# Patient Record
Sex: Male | Born: 1985
Health system: Southern US, Community
[De-identification: ages and names within clinical notes are randomized; demographics above are authoritative.]

## PROBLEM LIST (undated history)

## (undated) DIAGNOSIS — Z720 Tobacco use: Secondary | ICD-10-CM

## (undated) DIAGNOSIS — I7100 Dissection of unspecified site of aorta: Secondary | ICD-10-CM

## (undated) HISTORY — PX: FRACTURE SURGERY: SHX138

## (undated) HISTORY — DX: Dissection of unspecified site of aorta: I71.00

## (undated) HISTORY — PX: HERNIA REPAIR: SHX51

## (undated) HISTORY — PX: HAND SURGERY: SHX662

---
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
## 2007-02-25 ENCOUNTER — Emergency Department (HOSPITAL_COMMUNITY): Admission: EM | Admit: 2007-02-25 | Discharge: 2007-02-25 | Payer: Self-pay | Admitting: Family Medicine

## 2008-02-01 ENCOUNTER — Emergency Department (HOSPITAL_COMMUNITY): Admission: EM | Admit: 2008-02-01 | Discharge: 2008-02-01 | Payer: Self-pay | Admitting: Emergency Medicine

## 2008-03-12 ENCOUNTER — Emergency Department (HOSPITAL_COMMUNITY): Admission: EM | Admit: 2008-03-12 | Discharge: 2008-03-12 | Payer: Self-pay | Admitting: Emergency Medicine

## 2008-06-22 ENCOUNTER — Emergency Department (HOSPITAL_COMMUNITY): Admission: EM | Admit: 2008-06-22 | Discharge: 2008-06-22 | Payer: Self-pay | Admitting: Emergency Medicine

## 2008-09-05 ENCOUNTER — Emergency Department (HOSPITAL_COMMUNITY): Admission: EM | Admit: 2008-09-05 | Discharge: 2008-09-05 | Payer: Self-pay | Admitting: Emergency Medicine

## 2008-10-01 ENCOUNTER — Emergency Department (HOSPITAL_COMMUNITY): Admission: EM | Admit: 2008-10-01 | Discharge: 2008-10-01 | Payer: Self-pay | Admitting: Emergency Medicine

## 2008-10-05 ENCOUNTER — Ambulatory Visit (HOSPITAL_COMMUNITY): Admission: RE | Admit: 2008-10-05 | Discharge: 2008-10-05 | Payer: Self-pay | Admitting: Orthopedic Surgery

## 2008-10-10 ENCOUNTER — Encounter: Admission: RE | Admit: 2008-10-10 | Discharge: 2008-11-21 | Payer: Self-pay | Admitting: Orthopedic Surgery

## 2008-12-31 ENCOUNTER — Emergency Department (HOSPITAL_COMMUNITY): Admission: EM | Admit: 2008-12-31 | Discharge: 2009-01-01 | Payer: Self-pay | Admitting: Emergency Medicine

## 2009-03-18 ENCOUNTER — Emergency Department (HOSPITAL_COMMUNITY): Admission: EM | Admit: 2009-03-18 | Discharge: 2009-03-18 | Payer: Self-pay | Admitting: Emergency Medicine

## 2009-03-19 ENCOUNTER — Emergency Department (HOSPITAL_BASED_OUTPATIENT_CLINIC_OR_DEPARTMENT_OTHER): Admission: EM | Admit: 2009-03-19 | Discharge: 2009-03-20 | Payer: Self-pay | Admitting: Emergency Medicine

## 2009-09-26 ENCOUNTER — Ambulatory Visit: Payer: Self-pay | Admitting: Interventional Radiology

## 2009-09-26 ENCOUNTER — Emergency Department (HOSPITAL_BASED_OUTPATIENT_CLINIC_OR_DEPARTMENT_OTHER): Admission: EM | Admit: 2009-09-26 | Discharge: 2009-09-26 | Payer: Self-pay | Admitting: Emergency Medicine

## 2010-09-28 LAB — POCT I-STAT, CHEM 8
BUN: 7 mg/dL (ref 6–23)
Calcium, Ion: 1.18 mmol/L (ref 1.12–1.32)
Chloride: 105 mEq/L (ref 96–112)
HCT: 47 % (ref 39.0–52.0)
Potassium: 3.8 mEq/L (ref 3.5–5.1)
TCO2: 26 mmol/L (ref 0–100)

## 2010-09-28 LAB — CBC
HCT: 45.2 % (ref 39.0–52.0)
MCV: 94.3 fL (ref 78.0–100.0)
RDW: 13 % (ref 11.5–15.5)
WBC: 8 10*3/uL (ref 4.0–10.5)

## 2010-09-28 LAB — DIFFERENTIAL
Basophils Relative: 1 % (ref 0–1)
Eosinophils Absolute: 0.3 10*3/uL (ref 0.0–0.7)
Eosinophils Relative: 4 % (ref 0–5)
Lymphs Abs: 3 10*3/uL (ref 0.7–4.0)
Monocytes Relative: 8 % (ref 3–12)
Neutrophils Relative %: 50 % (ref 43–77)

## 2010-10-01 LAB — CBC
HCT: 48.3 % (ref 39.0–52.0)
Hemoglobin: 16.6 g/dL (ref 13.0–17.0)
MCHC: 34.3 g/dL (ref 30.0–36.0)
MCV: 93.7 fL (ref 78.0–100.0)
Platelets: 147 10*3/uL — ABNORMAL LOW (ref 150–400)
RBC: 5.15 MIL/uL (ref 4.22–5.81)
RDW: 13.4 % (ref 11.5–15.5)
WBC: 4.7 10*3/uL (ref 4.0–10.5)

## 2010-10-06 LAB — BASIC METABOLIC PANEL
BUN: 10 mg/dL (ref 6–23)
CO2: 29 mEq/L (ref 19–32)
Chloride: 102 mEq/L (ref 96–112)
Creatinine, Ser: 1.45 mg/dL (ref 0.4–1.5)
Glucose, Bld: 101 mg/dL — ABNORMAL HIGH (ref 70–99)
Potassium: 4.5 mEq/L (ref 3.5–5.1)

## 2010-11-04 NOTE — Op Note (Signed)
NAMEARGIE, APPLEGATE           ACCOUNT NO.:  [id number]   MEDICAL RECORD NO.:  [id number]          PATIENT TYPE:  AMB   LOCATION:  SDS                          FACILITY:  [facility]   PHYSICIAN:  Artist Pais. Weingold, M.D.DATE OF BIRTH:  1986-06-12   DATE OF PROCEDURE:  10/05/2008  DATE OF DISCHARGE:  10/05/2008                               OPERATIVE REPORT   PREOPERATIVE DIAGNOSIS:  Proximal pole scaphoid fracture on the left.   POSTOPERATIVE DIAGNOSIS:  Proximal pole scaphoid fracture on the left.   ANESTHESIA:  Open reduction and internal fixation of above using 18-mm  standard Accu-Chek screw, dorsal approach.   SURGEON:  Artist Pais. Mina Marble, MD   ASSISTANT:  None.   ANESTHESIA:  General.   TOURNIQUET TIME:  An hour and 15 minutes.   COMPLICATIONS:  None.   DRAINS:  None.   OPERATIVE REPORT:  The patient was taken to the operating suite.  After  induction of adequate general anesthesia, the left upper extremity was  prepped and draped in the usual sterile fashion.  An Esmarch was used to  exsanguinate the limb.  The tourniquet was inflated to 275 mm at this  point in time.  Percutaneous attempts were made to try and localize the  proximal pole of the scaphoid to do a percutaneous scaphoid screw  fixation.  After multiple attempts and 40 minutes of tourniquet time,  decision was made to proceed with an open procedure.  Once this was  done, incision was made dorsally and skin was incised.  Dissection was  carried down the interval between the third and fourth dorsal  compartments.  EPL was transposed.  The capsule overlying the scaphoid  was incised.  Once this was done, the proximal pole of the scaphoid was  exposed.  Guidewire was placed into the proximal pole and 18-mm Accu-  Chek standard screw was placed from proximal to distal under  intraoperative fluoroscopic guidance, reducing the fracture.  The K-wire  was removed.  The wound was thoroughly irrigated.  The  capsule was  repaired with 4-0 Vicryl and the skin with 4-0 nylon.  Xeroform, 4 x 4s,  fluffs, and regular splint was applied.  The patient tolerated the  procedure well and went to recovery room in stable fashion.      Artist Pais Mina Marble, M.D.  Electronically Signed    MAW/MEDQ  D:  10/05/2008  T:  10/06/2008  Job:  [id number]

## 2011-02-08 ENCOUNTER — Emergency Department (HOSPITAL_BASED_OUTPATIENT_CLINIC_OR_DEPARTMENT_OTHER)
Admission: EM | Admit: 2011-02-08 | Discharge: 2011-02-08 | Disposition: A | Discharge status: Home / Self Care | Payer: No Typology Code available for payment source | Attending: Emergency Medicine | Admitting: Emergency Medicine

## 2011-02-08 ENCOUNTER — Emergency Department (INDEPENDENT_AMBULATORY_CARE_PROVIDER_SITE_OTHER): Payer: No Typology Code available for payment source

## 2011-02-08 ENCOUNTER — Encounter: Payer: Self-pay | Admitting: Emergency Medicine

## 2011-02-08 DIAGNOSIS — Z043 Encounter for examination and observation following other accident: Secondary | ICD-10-CM

## 2011-02-08 DIAGNOSIS — M25569 Pain in unspecified knee: Secondary | ICD-10-CM

## 2011-02-08 DIAGNOSIS — Y92410 Unspecified street and highway as the place of occurrence of the external cause: Secondary | ICD-10-CM | POA: Insufficient documentation

## 2011-02-08 DIAGNOSIS — S139XXA Sprain of joints and ligaments of unspecified parts of neck, initial encounter: Secondary | ICD-10-CM | POA: Insufficient documentation

## 2011-02-08 DIAGNOSIS — R071 Chest pain on breathing: Secondary | ICD-10-CM | POA: Insufficient documentation

## 2011-02-08 DIAGNOSIS — S161XXA Strain of muscle, fascia and tendon at neck level, initial encounter: Secondary | ICD-10-CM

## 2011-02-08 DIAGNOSIS — R0789 Other chest pain: Secondary | ICD-10-CM

## 2011-02-08 MED ORDER — TRAMADOL HCL 50 MG PO TABS: 50.0000 mg | ORAL_TABLET | Freq: Four times a day (QID) | ORAL | Status: AC | PRN

## 2011-02-08 MED ORDER — ONDANSETRON 4 MG PO TBDP
4.0000 mg | ORAL_TABLET | Freq: Once | ORAL | Status: AC
  Administered 2011-02-08: 4 mg via ORAL
  Filled 2011-02-08: qty 1
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
## 2011-02-11 NOTE — ED Provider Notes (Signed)
History/physical exam/procedure(s) were performed by non-physician practitioner and as supervising physician I was immediately available for consultation/collaboration. I have reviewed all notes and am in agreement with care and plan.   Hilario Quarry, MD 02/11/11 [phone]

## 2011-07-09 ENCOUNTER — Emergency Department (INDEPENDENT_AMBULATORY_CARE_PROVIDER_SITE_OTHER): Payer: Self-pay

## 2011-07-09 ENCOUNTER — Encounter (HOSPITAL_BASED_OUTPATIENT_CLINIC_OR_DEPARTMENT_OTHER): Payer: Self-pay | Admitting: *Deleted

## 2011-07-09 ENCOUNTER — Emergency Department (HOSPITAL_BASED_OUTPATIENT_CLINIC_OR_DEPARTMENT_OTHER)
Admission: EM | Admit: 2011-07-09 | Discharge: 2011-07-09 | Disposition: A | Discharge status: Home / Self Care | Payer: Self-pay | Attending: Emergency Medicine | Admitting: Emergency Medicine

## 2011-07-09 DIAGNOSIS — R0789 Other chest pain: Secondary | ICD-10-CM

## 2011-07-09 DIAGNOSIS — R071 Chest pain on breathing: Secondary | ICD-10-CM | POA: Insufficient documentation

## 2011-07-09 DIAGNOSIS — R091 Pleurisy: Secondary | ICD-10-CM | POA: Insufficient documentation

## 2011-07-09 DIAGNOSIS — R079 Chest pain, unspecified: Secondary | ICD-10-CM

## 2011-07-09 MED ORDER — IBUPROFEN 800 MG PO TABS: 800.0000 mg | ORAL_TABLET | Freq: Three times a day (TID) | ORAL | Status: AC

## 2011-07-09 MED ORDER — IBUPROFEN 800 MG PO TABS
800.0000 mg | ORAL_TABLET | Freq: Once | ORAL | Status: AC
  Administered 2011-07-09: 800 mg via ORAL
  Filled 2011-07-09: qty 1

## 2011-07-09 MED ORDER — METHOCARBAMOL 500 MG PO TABS: 500.0000 mg | ORAL_TABLET | Freq: Two times a day (BID) | ORAL | Status: AC

## 2011-07-09 NOTE — ED Notes (Signed)
Pt. Reports the L front chest pain started on Today at approx. 11am.  Pt. Reports he runs and exercises.

## 2011-07-09 NOTE — ED Notes (Signed)
Pt. Reports he is hurting in the L chest area.  Pt. Has no c/o shortness of breath and is alert and oriented.

## 2011-07-09 NOTE — ED Notes (Signed)
Pt amb to triage with quick steady gait in nad. Pt reports chest "sore" x 11 today, sudden onset, chest is tender to palp, pain increases with movements of arms. Pt denies any memory of injury, denies any sob, nausea, diaphoresis or any other c/o. Pt states he took 3 tums with some relief.

## 2011-07-09 NOTE — ED Provider Notes (Signed)
History     CSN: [id number]  Arrival date & time 07/09/11  1846   First MD Initiated Contact with Patient 07/09/11 1906      Chief Complaint  Patient presents with  . Pleurisy    (Consider location/radiation/quality/duration/timing/severity/associated sxs/prior treatment) The history is provided by the patient and a parent.   patient presents with left-sided chest pain x12 hours. Patient used TUMS with no relief of his symptoms. Pain is described as sharp in nature, worse with movement of his arm or trunk. Notes that he has been running a lot. Denies any cough or fever. No associated dyspnea or diaphoresis. Denies any heavy lifting. No prior history of same. No rash noted to the skin.  History reviewed. No pertinent past medical history.  History reviewed. No pertinent past surgical history.  History reviewed. No pertinent family history.  History  Substance Use Topics  . Smoking status: Never Smoker   . Smokeless tobacco: Not on file  . Alcohol Use: No      Review of Systems  All other systems reviewed and are negative.    Allergies  Review of patient's allergies indicates no known allergies.  Home Medications   Current Outpatient Rx  Name Route Sig Dispense Refill  . CALCIUM CARBONATE ANTACID 500 MG PO CHEW Oral Chew 1,500 tablets by mouth once as needed. For indigestion      BP 132/80  Pulse 71  Temp(Src) 98.8 F (37.1 C) (Oral)  Resp 18  Ht 6\' 2"  (1.88 m)  Wt 312 lb (141.522 kg)  BMI 40.06 kg/m2  SpO2 100%  Physical Exam  Nursing note and vitals reviewed. Constitutional: He is oriented to person, place, and time. He appears well-developed and well-nourished.  Non-toxic appearance. No distress.  HENT:  Head: Normocephalic and atraumatic.  Eyes: Conjunctivae, EOM and lids are normal. Pupils are equal, round, and reactive to light.  Neck: Normal range of motion. Neck supple. No tracheal deviation present. No mass present.  Cardiovascular: Normal rate,  regular rhythm and normal heart sounds.  Exam reveals no gallop.   No murmur heard. Pulmonary/Chest: Effort normal and breath sounds normal. No stridor. No respiratory distress. He has no decreased breath sounds. He has no wheezes. He has no rhonchi. He has no rales. He exhibits tenderness and bony tenderness. He exhibits no crepitus.    Abdominal: Soft. Normal appearance and bowel sounds are normal. He exhibits no distension. There is no tenderness. There is no rebound and no CVA tenderness.  Musculoskeletal: Normal range of motion. He exhibits no edema and no tenderness.  Neurological: He is alert and oriented to person, place, and time. He has normal strength. No cranial nerve deficit or sensory deficit. GCS eye subscore is 4. GCS verbal subscore is 5. GCS motor subscore is 6.  Skin: Skin is warm and dry. No abrasion and no rash noted.  Psychiatric: He has a normal mood and affect. His speech is normal and behavior is normal.    ED Course  Procedures (including critical care time)  Labs Reviewed - No data to display No results found.   No diagnosis found.    MDM  Chest x-ray is negative. Will place on anti-inflammatories and muscle relaxants and discharged to home         Toy Baker, MD 07/09/11 2118

## 2011-09-26 ENCOUNTER — Emergency Department (HOSPITAL_BASED_OUTPATIENT_CLINIC_OR_DEPARTMENT_OTHER)
Admission: EM | Admit: 2011-09-26 | Discharge: 2011-09-26 | Disposition: A | Discharge status: Home / Self Care | Payer: Self-pay | Attending: Emergency Medicine | Admitting: Emergency Medicine

## 2011-09-26 ENCOUNTER — Encounter (HOSPITAL_BASED_OUTPATIENT_CLINIC_OR_DEPARTMENT_OTHER): Payer: Self-pay | Admitting: Emergency Medicine

## 2011-09-26 DIAGNOSIS — K0889 Other specified disorders of teeth and supporting structures: Secondary | ICD-10-CM

## 2011-09-26 DIAGNOSIS — K089 Disorder of teeth and supporting structures, unspecified: Secondary | ICD-10-CM | POA: Insufficient documentation

## 2011-09-26 MED ORDER — HYDROCODONE-ACETAMINOPHEN 5-325 MG PO TABS: 2.0000 | ORAL_TABLET | Freq: Four times a day (QID) | ORAL | Status: AC | PRN

## 2011-09-26 MED ORDER — HYDROCODONE-ACETAMINOPHEN 5-325 MG PO TABS
2.0000 | ORAL_TABLET | Freq: Once | ORAL | Status: AC
  Administered 2011-09-26: 2 via ORAL
  Filled 2011-09-26: qty 2

## 2011-09-26 NOTE — ED Provider Notes (Signed)
History     CSN: [id number]  Arrival date & time 09/26/11  0041   First MD Initiated Contact with Patient 09/26/11 0107      Chief Complaint  Patient presents with  . Dental Pain  . Headache    (Consider location/radiation/quality/duration/timing/severity/associated sxs/prior treatment) HPI The patient presents with one day of pain in his left superior jaw.  Patient has known cavity in the region.  He awoke this morning, approximately 18 hours ago with pain.  Since onset the pain has been focally in the left upper jaw without radiation.  Pain is sharp, worse with clenching of his teeth.  No relief with Advil or Orajel.  No fevers, no chills, no facial swelling, no dyspnea aphasia or dyspnea. History reviewed. No pertinent past medical history.  Past Surgical History  Procedure Date  . Hand surgery     No family history on file.  History  Substance Use Topics  . Smoking status: Never Smoker   . Smokeless tobacco: Not on file  . Alcohol Use: No      Review of Systems  All other systems reviewed and are negative.    Allergies  Review of patient's allergies indicates no known allergies.  Home Medications   Current Outpatient Rx  Name Route Sig Dispense Refill  . BENZOCAINE 10 % MT GEL Mouth/Throat Use as directed 1 application in the mouth or throat as needed.    . IBUPROFEN 200 MG PO TABS Oral Take 200 mg by mouth as needed.    Marland Kitchen CALCIUM CARBONATE ANTACID 500 MG PO CHEW Oral Chew 1,500 tablets by mouth once as needed. For indigestion    . HYDROCODONE-ACETAMINOPHEN 5-325 MG PO TABS Oral Take 2 tablets by mouth every 6 (six) hours as needed for pain. 12 tablet 0    BP 149/92  Pulse 69  Temp(Src) 98.8 F (37.1 C) (Oral)  Resp 18  SpO2 97%  Physical Exam  Nursing note and vitals reviewed. Constitutional: He is oriented to person, place, and time. He appears well-developed and well-nourished. No distress.  HENT:  Head: Normocephalic and atraumatic.    Mouth/Throat: Uvula is midline, oropharynx is clear and moist and mucous membranes are normal. No oropharyngeal exudate, posterior oropharyngeal edema, posterior oropharyngeal erythema or tonsillar abscesses.    Eyes: Conjunctivae and EOM are normal.  Cardiovascular: Normal rate and regular rhythm.   Pulmonary/Chest: Effort normal. No stridor.  Neurological: He is alert and oriented to person, place, and time. No cranial nerve deficit. Coordination normal.  Skin: Skin is warm and dry. He is not diaphoretic.  Psychiatric: He has a normal mood and affect.    ED Course  Procedures (including critical care time)  Labs Reviewed - No data to display No results found.   1. Pain, dental       MDM  This generally well young male presents with one day of dental pain.  On exam the patient is in no distress unremarkable vital signs and no evidence of ongoing abscess.  Patient received oral analgesics.  Discussed the need for ongoing, close followup with dentistry.  The patient was discharged in stable condition      Gerhard Munch, MD 09/26/11 [phone]

## 2011-09-26 NOTE — ED Notes (Signed)
Pt c/o dental pain and headache

## 2011-09-26 NOTE — Discharge Instructions (Signed)
Dental Pain  A tooth ache may be caused by cavities (tooth decay). Cavities expose the nerve of the tooth to air and hot or cold temperatures. It may come from an infection or abscess (also called a boil or furuncle) around your tooth. It is also often caused by dental caries (tooth decay). This causes the pain you are having.  DIAGNOSIS   Your caregiver can diagnose this problem by exam.  TREATMENT   · If caused by an infection, it may be treated with medications which kill germs (antibiotics) and pain medications as prescribed by your caregiver. Take medications as directed.  · Only take over-the-counter or prescription medicines for pain, discomfort, or fever as directed by your caregiver.  · Whether the tooth ache today is caused by infection or dental disease, you should see your dentist as soon as possible for further care.  SEEK MEDICAL CARE IF:  The exam and treatment you received today has been provided on an emergency basis only. This is not a substitute for complete medical or dental care. If your problem worsens or new problems (symptoms) appear, and you are unable to meet with your dentist, call or return to this location.  SEEK IMMEDIATE MEDICAL CARE IF:   · You have a fever.  · You develop redness and swelling of your face, jaw, or neck.  · You are unable to open your mouth.  · You have severe pain uncontrolled by pain medicine.  MAKE SURE YOU:   · Understand these instructions.  · Will watch your condition.  · Will get help right away if you are not doing well or get worse.  Document Released: 06/08/2005 Document Revised: 05/28/2011 Document Reviewed: 01/25/2008  [employer]® Patient Information ©2012 [employer].

## 2014-10-09 ENCOUNTER — Emergency Department (HOSPITAL_COMMUNITY)
Admission: EM | Admit: 2014-10-09 | Discharge: 2014-10-09 | Discharge status: Absent without leave | Payer: No Typology Code available for payment source

## 2014-10-09 ENCOUNTER — Emergency Department (HOSPITAL_COMMUNITY)
Admission: EM | Admit: 2014-10-09 | Discharge: 2014-10-09 | Discharge status: Against Medical Advice | Payer: Self-pay | Attending: Emergency Medicine | Admitting: Emergency Medicine

## 2014-10-09 ENCOUNTER — Encounter (HOSPITAL_COMMUNITY): Payer: Self-pay | Admitting: Emergency Medicine

## 2014-10-09 DIAGNOSIS — R1031 Right lower quadrant pain: Secondary | ICD-10-CM | POA: Insufficient documentation

## 2014-10-09 NOTE — ED Notes (Signed)
Pt called for room placement with no answer. 

## 2014-10-09 NOTE — ED Notes (Signed)
Pt reports eating a sub and began having right lower abdominal pain for the past 30 minutes. Pt reports he is unable to sleep due to pain.

## 2014-10-11 ENCOUNTER — Emergency Department (HOSPITAL_BASED_OUTPATIENT_CLINIC_OR_DEPARTMENT_OTHER)
Admission: EM | Admit: 2014-10-11 | Discharge: 2014-10-11 | Disposition: A | Discharge status: Home / Self Care | Payer: Self-pay | Attending: Emergency Medicine | Admitting: Emergency Medicine

## 2014-10-11 ENCOUNTER — Encounter (HOSPITAL_BASED_OUTPATIENT_CLINIC_OR_DEPARTMENT_OTHER): Payer: Self-pay | Admitting: *Deleted

## 2014-10-11 DIAGNOSIS — Z79899 Other long term (current) drug therapy: Secondary | ICD-10-CM | POA: Insufficient documentation

## 2014-10-11 DIAGNOSIS — R1031 Right lower quadrant pain: Secondary | ICD-10-CM | POA: Insufficient documentation

## 2014-10-11 LAB — URINE MICROSCOPIC-ADD ON

## 2014-10-11 LAB — URINALYSIS, ROUTINE W REFLEX MICROSCOPIC
BILIRUBIN URINE: NEGATIVE
Glucose, UA: NEGATIVE mg/dL
HGB URINE DIPSTICK: NEGATIVE
Ketones, ur: NEGATIVE mg/dL
Nitrite: NEGATIVE
PROTEIN: NEGATIVE mg/dL
Specific Gravity, Urine: 1.024 (ref 1.005–1.030)
UROBILINOGEN UA: 1 mg/dL (ref 0.0–1.0)
pH: 8 (ref 5.0–8.0)

## 2014-10-11 NOTE — Discharge Instructions (Signed)

## 2014-10-11 NOTE — ED Notes (Signed)
MD at bedside. 

## 2014-10-11 NOTE — ED Notes (Signed)
Abdominal pain x 2 days. Feels like gas. Pain is in his right upper quadrant.

## 2014-10-11 NOTE — ED Provider Notes (Signed)
CSN: [id number]     Arrival date & time 10/11/14  1042 History   First MD Initiated Contact with Patient 10/11/14 1122     Chief Complaint  Patient presents with  . Abdominal Pain     (Consider location/radiation/quality/duration/timing/severity/associated sxs/prior Treatment) Patient is a 29 y.o. male presenting with abdominal pain. The history is provided by the patient.  Abdominal Pain Pain location:  RLQ Pain quality: aching and cramping   Pain radiates to:  Groin Pain severity:  Severe Onset quality:  Sudden Duration: . Timing:  Constant Progression:  Resolved Chronicity:  New Context comment:  Started after he ate an [place] [employer] [place] [employer] Relieved by:  None tried Worsened by:  Nothing tried Ineffective treatments:  None tried Associated symptoms: no anorexia, no chest pain, no chills, no constipation, no diarrhea, no dysuria, no fever, no hematuria, no nausea and no vomiting   Risk factors: no alcohol abuse, has not had multiple surgeries and no recent hospitalization     History reviewed. No pertinent past medical history. Past Surgical History  Procedure Laterality Date  . Hand surgery    . Hernia repair     No family history on file. History  Substance Use Topics  . Smoking status: Never Smoker   . Smokeless tobacco: Not on file  . Alcohol Use: No    Review of Systems  Constitutional: Negative for fever and chills.  Cardiovascular: Negative for chest pain.  Gastrointestinal: Positive for abdominal pain. Negative for nausea, vomiting, diarrhea, constipation and anorexia.  Genitourinary: Negative for dysuria and hematuria.  All other systems reviewed and are negative.     Allergies  Review of patient's allergies indicates no known allergies.  Home Medications   Prior to Admission medications   Medication Sig Start Date End Date Taking? Authorizing Provider  benzocaine (ORAJEL) 10 % mucosal gel Use as directed 1 application in the mouth or throat  as needed.    Historical Provider, MD  calcium carbonate (TUMS - DOSED IN MG ELEMENTAL CALCIUM) 500 MG chewable tablet Chew 1,500 tablets by mouth once as needed. For indigestion    Historical Provider, MD  ibuprofen (ADVIL,MOTRIN) 200 MG tablet Take 200 mg by mouth as needed.    Historical Provider, MD   BP 130/90 mmHg  Pulse 63  Temp(Src) 98.6 F (37 C) (Oral)  Resp 18  Ht  (1.905 m)  Wt 324 lb (146.965 kg)  BMI 40.50 kg/m2  SpO2 99% Physical Exam  Constitutional: He is oriented to person, place, and time. He appears well-developed and well-nourished. No distress.  HENT:  Head: Normocephalic and atraumatic.  Mouth/Throat: Oropharynx is clear and moist.  Eyes: Conjunctivae and EOM are normal. Pupils are equal, round, and reactive to light.  Neck: Normal range of motion. Neck supple.  Cardiovascular: Normal rate, regular rhythm and intact distal pulses.   No murmur heard. Pulmonary/Chest: Effort normal and breath sounds normal. No respiratory distress. He has no wheezes. He has no rales.  Abdominal: Soft. He exhibits no distension. There is no tenderness. There is no rebound and no guarding.  Musculoskeletal: Normal range of motion. He exhibits no edema or tenderness.  Neurological: He is alert and oriented to person, place, and time.  Skin: Skin is warm and dry. No rash noted. No erythema.  Psychiatric: He has a normal mood and affect. His behavior is normal.  Nursing note and vitals reviewed.   ED Course  Procedures (including critical care time) Labs Review Labs Reviewed  URINALYSIS, ROUTINE W REFLEX MICROSCOPIC - Abnormal; Notable for the following:    APPearance CLOUDY (*)    Leukocytes, UA SMALL (*)    All other components within normal limits  URINE CULTURE  URINE MICROSCOPIC-ADD ON    Imaging Review No results found.   EKG Interpretation None      MDM   Final diagnoses:  Right lower quadrant abdominal pain    Patient presenting for evaluation  after having severe abdominal pain 2 days ago. He states currently he has no abdominal tenderness and just wanted to be checked before he started work. He has had no difficulty eating and denies any nausea, vomiting or urinary complaints currently. Approximately 2 days ago when the pain started after he ate [place] from [employer] he was having severe right lower quadrant pain that shot into his testicle and groin. However that has subsided. He currently denies any dysuria or penile discharge. UA done here showed no signs of red blood cells but did have 3-6 white blood cells. Urine cultured however at this time will not treat.    Gwyneth SproutWhitney Angeleigh Chiasson, MD 10/11/14 1204

## 2014-10-12 LAB — URINE CULTURE: Colony Count: 6000

## 2017-04-02 ENCOUNTER — Emergency Department
Admission: EM | Admit: 2017-04-02 | Discharge: 2017-04-02 | Disposition: A | Discharge status: Home / Self Care | Payer: Self-pay | Attending: Emergency Medicine | Admitting: Emergency Medicine

## 2017-04-02 ENCOUNTER — Encounter: Payer: Self-pay | Admitting: Emergency Medicine

## 2017-04-02 DIAGNOSIS — K029 Dental caries, unspecified: Secondary | ICD-10-CM | POA: Insufficient documentation

## 2017-04-02 MED ORDER — IBUPROFEN 600 MG PO TABS: 600.0000 mg | ORAL_TABLET | Freq: Three times a day (TID) | ORAL | 0 refills | Status: DC | PRN

## 2017-04-02 MED ORDER — LIDOCAINE VISCOUS 2 % MT SOLN: 5.0000 mL | Freq: Four times a day (QID) | OROMUCOSAL | 0 refills | Status: DC | PRN

## 2017-04-02 MED ORDER — AMOXICILLIN 500 MG PO CAPS: 500.0000 mg | ORAL_CAPSULE | Freq: Three times a day (TID) | ORAL | 0 refills | Status: DC

## 2017-04-02 MED ORDER — TRAMADOL HCL 50 MG PO TABS: 50.0000 mg | ORAL_TABLET | Freq: Two times a day (BID) | ORAL | 0 refills | Status: DC | PRN

## 2017-04-02 NOTE — ED Provider Notes (Signed)
[facility] Emergency Department Provider Note   ____________________________________________   First MD Initiated Contact with Patient 04/02/17 1027     (approximate)  I have reviewed the triage vital signs and the nursing notes.   HISTORY  Chief Complaint Dental Pain    HPI Aaron [name] is a 31 y.o. male patient complaining of dental pain which started yesterday. Patient state he has a cracked tooth.patient rates his pain as a 10 over 10. o palliative measures for complaint. Patient as referral to dental clinic.   History reviewed. No pertinent past medical history.  There are no active problems to display for this patient.   Past Surgical History:  Procedure Laterality Date  . FRACTURE SURGERY    . HAND SURGERY    . HERNIA REPAIR      Prior to Admission medications   Medication Sig Start Date End Date Taking? Authorizing Provider  amoxicillin (AMOXIL) 500 MG capsule Take 1 capsule (500 mg total) by mouth 3 (three) times daily. 04/02/17   Joni Reining, PA-C  benzocaine (ORAJEL) 10 % mucosal gel Use as directed 1 application in the mouth or throat as needed.    [provider]  calcium carbonate (TUMS - DOSED IN MG ELEMENTAL CALCIUM) 500 MG chewable tablet Chew 1,500 tablets by mouth once as needed. For indigestion    [provider]  ibuprofen (ADVIL,MOTRIN) 200 MG tablet Take 200 mg by mouth as needed.    [provider]  ibuprofen (ADVIL,MOTRIN) 600 MG tablet Take 1 tablet (600 mg total) by mouth every 8 (eight) hours as needed. 04/02/17   Joni Reining, PA-C  lidocaine (XYLOCAINE) 2 % solution Use as directed 5 mLs in the mouth or throat every 6 (six) hours as needed for mouth pain. 04/02/17   Joni Reining, PA-C  traMADol (ULTRAM) 50 MG tablet Take 1 tablet (50 mg total) by mouth every 12 (twelve) hours as needed. 04/02/17   Joni Reining, PA-C    Allergies Patient has no known allergies.  No  family history on file.  Social History Social History  Substance Use Topics  . Smoking status: Never Smoker  . Smokeless tobacco: Never Used  . Alcohol use No    Review of Systems Constitutional: No fever/chills Eyes: No visual changes. ENT: No sore throat. Ental pain Cardiovascular: Denies chest pain. Respiratory: Denies shortness of breath. Gastrointestinal: No abdominal pain.  No nausea, no vomiting.  No diarrhea.  No constipation. Genitourinary: Negative for dysuria. Musculoskeletal: Negative for back pain. Skin: Negative for rash. Neurological: Negative for headaches, focal weakness or numbness.   ____________________________________________   PHYSICAL EXAM:  VITAL SIGNS: ED Triage Vitals  Enc Vitals Group     BP 04/02/17 0959 138/83     Pulse Rate 04/02/17 0959 60     Resp 04/02/17 0959 16     Temp 04/02/17 0959 98.3 F (36.8 C)     Temp Source 04/02/17 0959 Oral     SpO2 04/02/17 0959 99 %     Weight 04/02/17 1000 290 lb (131.5 kg)     Height 04/02/17 1000  (1.905 m)     Head Circumference --      Peak Flow --      Pain Score 04/02/17 0951 10     Pain Loc --      Pain Edu? --      Excl. in GC? --    Constitutional: Alert and oriented. Well appearing  and in no acute distress. Mouth/Throat: Mucous membranes are moist.  Oropharynx non-erythematous.fractured tooth 14 Neck: No stridor.  Cardiovascular: Normal rate, regular rhythm. Grossly normal heart sounds.  Good peripheral circulation. Respiratory: Normal respiratory effort.  No retractions. Lungs CTAB. Neurologic:  Normal speech and language. No gross focal neurologic deficits are appreciated. No gait instability. Skin:  Skin is warm, dry and intact. No rash noted. Psychiatric: Mood and affect are normal. Speech and behavior are normal.  ____________________________________________   LABS (all labs ordered are listed, but only abnormal results are displayed)  Labs Reviewed - No data to  display ____________________________________________  EKG   ____________________________________________  RADIOLOGY  No results found.  ____________________________________________   PROCEDURES  Procedure(s) performed: None  Procedures  Critical Care performed: No  ____________________________________________   INITIAL IMPRESSION / ASSESSMENT AND PLAN / ED COURSE  As part of my medical decision making, I reviewed the following data within the electronic MEDICAL RECORD NUMBER    Dental pain secondary to fractured tooth #14.patient given discharge Instructions. Patient advised follow-up with the [facility] dental school. Advised take medication as directed.      ____________________________________________   FINAL CLINICAL IMPRESSION(S) / ED DIAGNOSES  Final diagnoses:  Pain due to dental caries      NEW MEDICATIONS STARTED DURING THIS VISIT:  New Prescriptions   AMOXICILLIN (AMOXIL) 500 MG CAPSULE    Take 1 capsule (500 mg total) by mouth 3 (three) times daily.   IBUPROFEN (ADVIL,MOTRIN) 600 MG TABLET    Take 1 tablet (600 mg total) by mouth every 8 (eight) hours as needed.   LIDOCAINE (XYLOCAINE) 2 % SOLUTION    Use as directed 5 mLs in the mouth or throat every 6 (six) hours as needed for mouth pain.   TRAMADOL (ULTRAM) 50 MG TABLET    Take 1 tablet (50 mg total) by mouth every 12 (twelve) hours as needed.     Note:  This document was prepared using Dragon voice recognition software and may include unintentional dictation errors.    Joni Reining, PA-C 04/02/17 1037    Minna Antis, MD 04/02/17 [phone]

## 2017-04-02 NOTE — ED Notes (Signed)
Pt c/o pain in the roof of the mouth, states the pain has worsened since last night. Pt is 9/10 currently. No meds taken at home.

## 2017-04-02 NOTE — ED Triage Notes (Signed)
Pt states that he has been having dental pain since yesterday

## 2017-09-03 ENCOUNTER — Emergency Department (HOSPITAL_BASED_OUTPATIENT_CLINIC_OR_DEPARTMENT_OTHER)
Admission: EM | Admit: 2017-09-03 | Discharge: 2017-09-03 | Disposition: A | Discharge status: Home / Self Care | Payer: Self-pay | Attending: Emergency Medicine | Admitting: Emergency Medicine

## 2017-09-03 ENCOUNTER — Other Ambulatory Visit: Payer: Self-pay

## 2017-09-03 ENCOUNTER — Encounter (HOSPITAL_BASED_OUTPATIENT_CLINIC_OR_DEPARTMENT_OTHER): Payer: Self-pay | Admitting: Emergency Medicine

## 2017-09-03 DIAGNOSIS — Z79899 Other long term (current) drug therapy: Secondary | ICD-10-CM | POA: Insufficient documentation

## 2017-09-03 DIAGNOSIS — K029 Dental caries, unspecified: Secondary | ICD-10-CM | POA: Insufficient documentation

## 2017-09-03 DIAGNOSIS — K0889 Other specified disorders of teeth and supporting structures: Secondary | ICD-10-CM

## 2017-09-03 MED ORDER — BUPIVACAINE-EPINEPHRINE (PF) 0.5% -1:200000 IJ SOLN
1.8000 mL | Freq: Once | INTRAMUSCULAR | Status: AC
  Administered 2017-09-03: 1.8 mL
  Filled 2017-09-03: qty 1.8

## 2017-09-03 MED ORDER — PENICILLIN V POTASSIUM 500 MG PO TABS: 500.0000 mg | ORAL_TABLET | Freq: Four times a day (QID) | ORAL | 0 refills | Status: AC

## 2017-09-03 MED ORDER — IBUPROFEN 600 MG PO TABS: 600.0000 mg | ORAL_TABLET | Freq: Four times a day (QID) | ORAL | 0 refills | Status: DC | PRN

## 2017-09-03 MED ORDER — TRAMADOL HCL 50 MG PO TABS: 50.0000 mg | ORAL_TABLET | Freq: Three times a day (TID) | ORAL | 0 refills | Status: DC | PRN

## 2017-09-03 MED FILL — PENICILLIN VK 500 MG TABLET: 500 | 10 days supply | Qty: 40 | Fill #0

## 2017-09-03 MED FILL — traMADol HCL 50 MG TABS: 50 | 4 days supply | Qty: 10 | Fill #0

## 2017-09-03 NOTE — Discharge Instructions (Signed)
Take the antibiotics prescribed you until finished.  Follow-up with a dentist as soon as you are able.

## 2017-09-03 NOTE — ED Provider Notes (Signed)
[facility] EMERGENCY DEPARTMENT Provider Note   CSN: [id number] Arrival date & time: 09/03/17  [id number]     History   Chief Complaint Chief Complaint  Patient presents with  . Dental Pain    HPI Aaron [name] is a 32 y.o. male.  The history is provided by the patient. No language interpreter was used.  Dental Pain   This is a new problem. The current episode started 12 to 24 hours ago. The problem occurs constantly. The problem has been gradually worsening. The pain is moderate. Treatments tried: "some pills" and topical analgesic. The treatment provided no relief.    History reviewed. No pertinent past medical history.  There are no active problems to display for this patient.   Past Surgical History:  Procedure Laterality Date  . FRACTURE SURGERY    . HAND SURGERY    . HERNIA REPAIR         Home Medications    Prior to Admission medications   Medication Sig Start Date End Date Taking? Authorizing Provider  amoxicillin (AMOXIL) 500 MG capsule Take 1 capsule (500 mg total) by mouth 3 (three) times daily. 04/02/17   Joni ReiningSmith, Ronald K, PA-C  benzocaine (ORAJEL) 10 % mucosal gel Use as directed 1 application in the mouth or throat as needed.    [provider]  calcium carbonate (TUMS - DOSED IN MG ELEMENTAL CALCIUM) 500 MG chewable tablet Chew 1,500 tablets by mouth once as needed. For indigestion    [provider]  ibuprofen (ADVIL,MOTRIN) 600 MG tablet Take 1 tablet (600 mg total) by mouth every 6 (six) hours as needed for mild pain or moderate pain. 09/03/17   Antony MaduraHumes, Shadrach Bartunek, PA-C  lidocaine (XYLOCAINE) 2 % solution Use as directed 5 mLs in the mouth or throat every 6 (six) hours as needed for mouth pain. 04/02/17   Joni ReiningSmith, Ronald K, PA-C  penicillin v potassium (VEETID) 500 MG tablet Take 1 tablet (500 mg total) by mouth 4 (four) times daily for 7 days. 09/03/17 09/10/17  Antony MaduraHumes, Zelena Bushong, PA-C  traMADol (ULTRAM) 50 MG tablet Take 1 tablet (50 mg  total) by mouth every 8 (eight) hours as needed for severe pain. 09/03/17   Antony MaduraHumes, Donyea Beverlin, PA-C    Family History History reviewed. No pertinent family history.  Social History Social History   Tobacco Use  . Smoking status: Never Smoker  . Smokeless tobacco: Never Used  Substance Use Topics  . Alcohol use: No  . Drug use: No     Allergies   Patient has no known allergies.   Review of Systems Review of Systems  Constitutional: Negative for fever.  HENT: Positive for dental problem. Negative for trouble swallowing.   Ten systems reviewed and are negative for acute change, except as noted in the HPI.    Physical Exam Updated Vital Signs BP (!) 144/94 (BP Location: Left Arm)   Pulse (!) 54   Temp 98.3 F (36.8 C) (Oral)   Resp 18   Ht 6\' 3"  (1.905 m)   Wt 126.6 kg (279 lb)   SpO2 100%   BMI 34.87 kg/m   Physical Exam  Constitutional: He is oriented to person, place, and time. He appears well-developed and well-nourished. No distress.  Nontoxic appearing and in NAD  HENT:  Head: Normocephalic and atraumatic.  Mouth/Throat: No oral lesions. Dental caries present. No dental abscesses.    No trismus. Dental decay. No gingival swelling or fluctuance.  Eyes: Conjunctivae and EOM are  normal. No scleral icterus.  Neck: Normal range of motion.  No nuchal rigidity or meningismus  Pulmonary/Chest: Effort normal. No respiratory distress.  Musculoskeletal: Normal range of motion.  Neurological: He is alert and oriented to person, place, and time. He exhibits normal muscle tone. Coordination normal.  Skin: Skin is warm and dry. No rash noted. He is not diaphoretic. No erythema. No pallor.  Psychiatric: He has a normal mood and affect. His behavior is normal.  Nursing note and vitals reviewed.    ED Treatments / Results  Labs (all labs ordered are listed, but only abnormal results are displayed) Labs Reviewed - No data to display  EKG  EKG Interpretation None        Radiology No results found.  Procedures Dental Block Date/Time: 09/03/2017 10:40 AM Performed by: Antony Madura, PA-C Authorized by: Antony Madura, PA-C   Consent:    Consent obtained:  Verbal   Consent given by:  Patient   Risks discussed:  Infection, nerve damage, swelling, pain and unsuccessful block   Alternatives discussed:  No treatment Indications:    Indications: dental pain   Location:    Block type:  Posterior superior alveolar Procedure details (see MAR for exact dosages):    Needle gauge:  25 G   Anesthetic injected:  Bupivacaine 0.5% WITH epi   Injection procedure:  Anatomic landmarks identified, introduced needle, incremental injection and negative aspiration for blood Post-procedure details:    Outcome:  Anesthesia achieved   Patient tolerance of procedure:  Tolerated well, no immediate complications   (including critical care time)  Medications Ordered in ED Medications  bupivacaine-epinephrine (MARCAINE W/ EPI) 0.5% -1:200000 injection 1.8 mL (1.8 mLs Infiltration Given by Other 09/03/17 1027)     Initial Impression / Assessment and Plan / ED Course  I have reviewed the triage vital signs and the nursing notes.  Pertinent labs & imaging results that were available during my care of the patient were reviewed by me and considered in my medical decision making (see chart for details).     Patient with toothache.  No gross abscess.  Exam unconcerning for Ludwig's angina or spread of infection.  Will treat with penicillin and pain medicine.  Urged patient to follow-up with dentist.  Return precautions discussed and provided. Patient discharged in stable condition with no unaddressed concerns.   Final Clinical Impressions(s) / ED Diagnoses   Final diagnoses:  Pain, dental  Dental caries    ED Discharge Orders        Ordered    penicillin v potassium (VEETID) 500 MG tablet  4 times daily     09/03/17 1039    traMADol (ULTRAM) 50 MG tablet  Every 8  hours PRN     09/03/17 1039    ibuprofen (ADVIL,MOTRIN) 600 MG tablet  Every 6 hours PRN     09/03/17 1039       Antony Madura, PA-C 09/03/17 1043    Terrilee Files, MD 09/04/17 1943

## 2017-09-03 NOTE — ED Notes (Signed)
NAD at this time. Pt is stable and going home.  

## 2017-09-03 NOTE — ED Triage Notes (Signed)
Patient states that he is having pain to his mouth since last night  - he reports a broken tooth

## 2017-09-03 NOTE — ED Notes (Signed)
Pt out to desk, upset about the wait. Assured pt the dr will see him as soon as he can.

## 2018-06-13 ENCOUNTER — Encounter (HOSPITAL_BASED_OUTPATIENT_CLINIC_OR_DEPARTMENT_OTHER): Payer: Self-pay

## 2018-06-13 ENCOUNTER — Other Ambulatory Visit: Payer: Self-pay

## 2018-06-13 ENCOUNTER — Emergency Department (HOSPITAL_BASED_OUTPATIENT_CLINIC_OR_DEPARTMENT_OTHER)
Admission: EM | Admit: 2018-06-13 | Discharge: 2018-06-13 | Discharge status: Against Medical Advice | Payer: Self-pay | Attending: Emergency Medicine | Admitting: Emergency Medicine

## 2018-06-13 DIAGNOSIS — R05 Cough: Secondary | ICD-10-CM | POA: Insufficient documentation

## 2018-06-13 DIAGNOSIS — R509 Fever, unspecified: Secondary | ICD-10-CM | POA: Insufficient documentation

## 2018-06-13 DIAGNOSIS — R059 Cough, unspecified: Secondary | ICD-10-CM

## 2018-06-13 MED ORDER — IBUPROFEN 800 MG PO TABS
800.0000 mg | ORAL_TABLET | Freq: Once | ORAL | Status: AC
  Administered 2018-06-13: 800 mg via ORAL
  Filled 2018-06-13: qty 1

## 2018-06-13 NOTE — ED Provider Notes (Signed)
Patient left from the emergency department before provider evaluation. I did not see or evaluate this patient. Triage note states patient had flu-like symptoms for 1 day.     Leretha DykesHernandez, Kaemon Barnett P, [place]-C 06/13/18 2055    Gwyneth SproutPlunkett, [place], MD 06/15/18 [phone]

## 2018-06-13 NOTE — ED Triage Notes (Addendum)
C/o flu like sx day 1-NAD-steady gait-pt requested RTW note-notified he would have to wait and discuss with EDP in tx area

## 2018-10-04 ENCOUNTER — Emergency Department (HOSPITAL_BASED_OUTPATIENT_CLINIC_OR_DEPARTMENT_OTHER)
Admission: EM | Admit: 2018-10-04 | Discharge: 2018-10-04 | Disposition: A | Discharge status: Home / Self Care | Payer: Self-pay | Attending: Emergency Medicine | Admitting: Emergency Medicine

## 2018-10-04 ENCOUNTER — Other Ambulatory Visit: Payer: Self-pay

## 2018-10-04 ENCOUNTER — Encounter (HOSPITAL_BASED_OUTPATIENT_CLINIC_OR_DEPARTMENT_OTHER): Payer: Self-pay

## 2018-10-04 DIAGNOSIS — Z79899 Other long term (current) drug therapy: Secondary | ICD-10-CM | POA: Insufficient documentation

## 2018-10-04 DIAGNOSIS — T7840XA Allergy, unspecified, initial encounter: Secondary | ICD-10-CM

## 2018-10-04 DIAGNOSIS — J45909 Unspecified asthma, uncomplicated: Secondary | ICD-10-CM | POA: Insufficient documentation

## 2018-10-04 DIAGNOSIS — F17200 Nicotine dependence, unspecified, uncomplicated: Secondary | ICD-10-CM | POA: Insufficient documentation

## 2018-10-04 MED ORDER — CETIRIZINE HCL 10 MG PO TABS: 10.0000 mg | ORAL_TABLET | Freq: Every day | ORAL | 0 refills | Status: DC

## 2018-10-04 MED ORDER — FLUTICASONE PROPIONATE 50 MCG/ACT NA SUSP: 1.0000 | Freq: Every day | NASAL | 0 refills | Status: DC

## 2018-10-04 NOTE — ED Notes (Signed)
Pt verbalizes understanding of d/c instructions and denies any further need at this time. 

## 2018-10-04 NOTE — ED Provider Notes (Signed)
[facility] EMERGENCY DEPARTMENT Provider Note   CSN: [id number] Arrival date & time: 10/04/18  1609    History   Chief Complaint No chief complaint on file.   HPI Aaron [name] is a 33 y.o. male with history of seasonal allergies who presents with a 3-day history of cough, runny nose, itchy eyes and nose.  He denies any fever.  He reports he has had some intermittent phlegm that he is coughing up.  He otherwise feels fine and denies any chest pain or shortness of breath, abdominal pain, nausea, vomiting, diarrhea.  He was sent by his work for evaluation before returning.  He has not been taking any medications over-the-counter for his symptoms.     HPI  History reviewed. No pertinent past medical history.  There are no active problems to display for this patient.   Past Surgical History:  Procedure Laterality Date  . FRACTURE SURGERY    . HAND SURGERY    . HERNIA REPAIR          Home Medications    Prior to Admission medications   Medication Sig Start Date End Date Taking? Authorizing Provider  amoxicillin (AMOXIL) 500 MG capsule Take 1 capsule (500 mg total) by mouth 3 (three) times daily. 04/02/17   Joni Reining, PA-C  benzocaine (ORAJEL) 10 % mucosal gel Use as directed 1 application in the mouth or throat as needed.    [provider]  calcium carbonate (TUMS - DOSED IN MG ELEMENTAL CALCIUM) 500 MG chewable tablet Chew 1,500 tablets by mouth once as needed. For indigestion    [provider]  cetirizine (ZYRTEC ALLERGY) 10 MG tablet Take 1 tablet (10 mg total) by mouth daily. 10/04/18   Slyvia Lartigue, Waylan Boga, PA-C  fluticasone (FLONASE) 50 MCG/ACT nasal spray Place 1 spray into both nostrils daily. 10/04/18   Christle Nolting, Waylan Boga, PA-C  ibuprofen (ADVIL,MOTRIN) 600 MG tablet Take 1 tablet (600 mg total) by mouth every 6 (six) hours as needed for mild pain or moderate pain. 09/03/17   Antony Madura, PA-C  lidocaine (XYLOCAINE) 2 % solution Use  as directed 5 mLs in the mouth or throat every 6 (six) hours as needed for mouth pain. 04/02/17   Joni Reining, PA-C  traMADol (ULTRAM) 50 MG tablet Take 1 tablet (50 mg total) by mouth every 8 (eight) hours as needed for severe pain. 09/03/17   Antony Madura, PA-C    Family History No family history on file.  Social History Social History   Tobacco Use  . Smoking status: Current Every Day Smoker  . Smokeless tobacco: Never Used  Substance Use Topics  . Alcohol use: No  . Drug use: No     Allergies   Patient has no known allergies.   Review of Systems Review of Systems  Constitutional: Negative for fever.  HENT: Positive for congestion and rhinorrhea. Negative for ear pain and sore throat.   Respiratory: Positive for cough. Negative for shortness of breath.   Cardiovascular: Negative for chest pain.  Gastrointestinal: Negative for abdominal pain, diarrhea, nausea and vomiting.     Physical Exam Updated Vital Signs BP 135/79   Pulse 64   Temp 98.6 F (37 C) (Oral)   Resp 16   Ht  (1.905 m)   Wt 131.5 kg   SpO2 100%   BMI 36.25 kg/m   Physical Exam Vitals signs and nursing note reviewed.  Constitutional:      General: He is  not in acute distress.    Appearance: He is well-developed. He is not diaphoretic.     Comments: Well-appearing  HENT:     Head: Normocephalic and atraumatic.     Right Ear: Tympanic membrane normal.     Left Ear: Tympanic membrane normal.     Mouth/Throat:     Pharynx: Posterior oropharyngeal erythema (mild) present. No oropharyngeal exudate.  Eyes:     General: No scleral icterus.       Right eye: No discharge.        Left eye: No discharge.     Conjunctiva/sclera: Conjunctivae normal.     Pupils: Pupils are equal, round, and reactive to light.  Neck:     Musculoskeletal: Normal range of motion and neck supple.     Thyroid: No thyromegaly.  Cardiovascular:     Rate and Rhythm: Normal rate and regular rhythm.     Heart  sounds: Normal heart sounds. No murmur. No friction rub. No gallop.   Pulmonary:     Effort: Pulmonary effort is normal. No respiratory distress.     Breath sounds: Normal breath sounds. No stridor. No wheezing or rales.  Abdominal:     General: Bowel sounds are normal. There is no distension.     Palpations: Abdomen is soft.     Tenderness: There is no abdominal tenderness. There is no guarding or rebound.  Lymphadenopathy:     Cervical: No cervical adenopathy.  Skin:    General: Skin is warm and dry.     Coloration: Skin is not pale.     Findings: No rash.  Neurological:     Mental Status: He is alert.     Coordination: Coordination normal.      ED Treatments / Results  Labs (all labs ordered are listed, but only abnormal results are displayed) Labs Reviewed - No data to display  EKG None  Radiology No results found.  Procedures Procedures (including critical care time)  Medications Ordered in ED Medications - No data to display   Initial Impression / Assessment and Plan / ED Course  I have reviewed the triage vital signs and the nursing notes.  Pertinent labs & imaging results that were available during my care of the patient were reviewed by me and considered in my medical decision making (see chart for details).        Patient presenting with 3 days of suspected allergy symptoms.  He has no fever.  Lungs are clear to auscultation.  No indication for chest x-ray at this time.  Will treat with Zyrtec and Flonase.  Doubt COVID-19 infection.  Patient advised to wear a mask, but given permission to return to work.  Patient advised to stop going to work if he develops fever, shortness of breath.  Given return precautions.  He understands and agrees with plan.  Patient vital stable her ED course and discharged in satisfactory condition.  Aaron [name] was evaluated in Emergency Department on 10/04/2018 for the symptoms described in the history of present illness.  He was evaluated in the context of the global COVID-19 pandemic, which necessitated consideration that the patient might be at risk for infection with the SARS-CoV-2 virus that causes COVID-19. Institutional protocols and algorithms that pertain to the evaluation of patients at risk for COVID-19 are in a state of rapid change based on information released by regulatory bodies including the CDC and federal and state organizations. These policies and algorithms were followed during the patient's care  in the ED.   Final Clinical Impressions(s) / ED Diagnoses   Final diagnoses:  Allergic state, initial encounter    ED Discharge Orders         Ordered    fluticasone (FLONASE) 50 MCG/ACT nasal spray  Daily     10/04/18 1630    cetirizine (ZYRTEC ALLERGY) 10 MG tablet  Daily     10/04/18 [address] [place], [place] 10/04/18 1636    Shaune Pollack, MD 10/04/18 1800

## 2018-10-04 NOTE — ED Triage Notes (Signed)
Pt c/o cough, runny itchy nose x 3 days. Pt denies fever. Pt states he was sent by his employer to be cleared for work.

## 2018-10-04 NOTE — Discharge Instructions (Addendum)
Use Flonase once daily in each nostril.  Take Zyrtec once daily.  Please do not go to work if you develop a fever over 100.4, difficulty breathing.  Please be evaluated if you develop significant shortness of breath, chest pain.

## 2020-02-04 ENCOUNTER — Emergency Department (HOSPITAL_BASED_OUTPATIENT_CLINIC_OR_DEPARTMENT_OTHER)
Admission: EM | Admit: 2020-02-04 | Discharge: 2020-02-04 | Disposition: A | Discharge status: Home / Self Care | Payer: Self-pay | Attending: Emergency Medicine | Admitting: Emergency Medicine

## 2020-02-04 ENCOUNTER — Emergency Department (HOSPITAL_BASED_OUTPATIENT_CLINIC_OR_DEPARTMENT_OTHER): Payer: Self-pay

## 2020-02-04 ENCOUNTER — Encounter (HOSPITAL_BASED_OUTPATIENT_CLINIC_OR_DEPARTMENT_OTHER): Payer: Self-pay | Admitting: Emergency Medicine

## 2020-02-04 ENCOUNTER — Other Ambulatory Visit: Payer: Self-pay

## 2020-02-04 DIAGNOSIS — F17200 Nicotine dependence, unspecified, uncomplicated: Secondary | ICD-10-CM | POA: Insufficient documentation

## 2020-02-04 DIAGNOSIS — R0789 Other chest pain: Secondary | ICD-10-CM | POA: Insufficient documentation

## 2020-02-04 DIAGNOSIS — R141 Gas pain: Secondary | ICD-10-CM | POA: Insufficient documentation

## 2020-02-04 DIAGNOSIS — R0602 Shortness of breath: Secondary | ICD-10-CM | POA: Insufficient documentation

## 2020-02-04 LAB — BASIC METABOLIC PANEL
Anion gap: 9 (ref 5–15)
BUN: 9 mg/dL (ref 6–20)
CO2: 28 mmol/L (ref 22–32)
Calcium: 9.7 mg/dL (ref 8.9–10.3)
Chloride: 100 mmol/L (ref 98–111)
Creatinine, Ser: 1.42 mg/dL — ABNORMAL HIGH (ref 0.61–1.24)
GFR calc Af Amer: >60 mL/min (ref >60)
GFR calc non Af Amer: >60 mL/min (ref >60)
Glucose, Bld: 101 mg/dL — ABNORMAL HIGH (ref 70–99)
Potassium: 4 mmol/L (ref 3.5–5.1)
Sodium: 137 mmol/L (ref 135–145)

## 2020-02-04 LAB — CBC
HCT: 47.1 % (ref 39.0–52.0)
Hemoglobin: 15.7 g/dL (ref 13.0–17.0)
MCH: 31.6 pg (ref 26.0–34.0)
MCHC: 33.3 g/dL (ref 30.0–36.0)
MCV: 94.8 fL (ref 80.0–100.0)
Platelets: 160 10*3/uL (ref 150–400)
RBC: 4.97 MIL/uL (ref 4.22–5.81)
RDW: 12.6 % (ref 11.5–15.5)
WBC: 6.4 10*3/uL (ref 4.0–10.5)
nRBC: 0 % (ref 0.0–0.2)

## 2020-02-04 LAB — TROPONIN I (HIGH SENSITIVITY)
Troponin I (High Sensitivity): 5 ng/L (ref <18)
Troponin I (High Sensitivity): 4 ng/L (ref <18)

## 2020-02-04 MED ORDER — OMEPRAZOLE 20 MG PO CPDR: 20.0000 mg | DELAYED_RELEASE_CAPSULE | Freq: Two times a day (BID) | ORAL | 0 refills | Status: DC

## 2020-02-04 NOTE — ED Triage Notes (Signed)
Pt here with center chest pain since yesterday. When he takes a deep breath he states it throbs

## 2020-02-04 NOTE — ED Provider Notes (Signed)
[facility] EMERGENCY DEPARTMENT Provider Note   CSN: [id number] Arrival date & time: 02/04/20  1112     History Chief Complaint  Patient presents with  . Chest Pain    Aaron [name] is a 34 y.o. male.  The history is provided by the patient. No language interpreter was used.  Chest Pain    34 year old male, history of tobacco use, presenting for evaluation of chest pain.  Patient report for the past 2 days he has been experiencing pain in his chest.  Pain is midsternal, started yesterday, pleuritic in nature, with some associated shortness of breath.  He also report increased belching, and feeling gassy.  Pain has been waxing and waning sometimes worsen with lying flat.  Pain is mostly resolved.  He admits to eating quite a few of seafood as well as using marijuana and tobacco recently.  He denies alcohol use or NSAID use.  He denies any subsequent cardiac history.  He does not complain of any fever or chills no productive cough no nausea or vomiting loss of taste or smell.  No complains of lightheadedness and dizziness.  No prior history of PE or DVT no recent surgery, prolonged bedrest, active cancer, taking hormone pills, having leg swelling or calf pain.  History reviewed. No pertinent past medical history.  There are no problems to display for this patient.   Past Surgical History:  Procedure Laterality Date  . FRACTURE SURGERY    . HAND SURGERY    . HERNIA REPAIR         History reviewed. No pertinent family history.  Social History   Tobacco Use  . Smoking status: Current Every Day Smoker  . Smokeless tobacco: Never Used  Vaping Use  . Vaping Use: Never used  Substance Use Topics  . Alcohol use: No  . Drug use: No    Home Medications Prior to Admission medications   Medication Sig Start Date End Date Taking? Authorizing Provider  amoxicillin (AMOXIL) 500 MG capsule Take 1 capsule (500 mg total) by mouth 3 (three) times daily. 04/02/17    Joni Reining, PA-C  benzocaine (ORAJEL) 10 % mucosal gel Use as directed 1 application in the mouth or throat as needed.    [provider]  calcium carbonate (TUMS - DOSED IN MG ELEMENTAL CALCIUM) 500 MG chewable tablet Chew 1,500 tablets by mouth once as needed. For indigestion    [provider]  cetirizine (ZYRTEC ALLERGY) 10 MG tablet Take 1 tablet (10 mg total) by mouth daily. 10/04/18   Law, Waylan Boga, PA-C  fluticasone (FLONASE) 50 MCG/ACT nasal spray Place 1 spray into both nostrils daily. 10/04/18   Law, Waylan Boga, PA-C  ibuprofen (ADVIL,MOTRIN) 600 MG tablet Take 1 tablet (600 mg total) by mouth every 6 (six) hours as needed for mild pain or moderate pain. 09/03/17   Antony Madura, PA-C  lidocaine (XYLOCAINE) 2 % solution Use as directed 5 mLs in the mouth or throat every 6 (six) hours as needed for mouth pain. 04/02/17   Joni Reining, PA-C  traMADol (ULTRAM) 50 MG tablet Take 1 tablet (50 mg total) by mouth every 8 (eight) hours as needed for severe pain. 09/03/17   Antony Madura, PA-C    Allergies    Patient has no known allergies.  Review of Systems   Review of Systems  Cardiovascular: Positive for chest pain.  All other systems reviewed and are negative.   Physical Exam Updated Vital Signs BP  130/80 (BP Location: Right Arm)   Pulse (!) 49   Temp 99 F (37.2 C)   Resp 15   SpO2 99%   Physical Exam Vitals and nursing note reviewed.  Constitutional:      General: He is not in acute distress.    Appearance: He is well-developed.  HENT:     Head: Atraumatic.  Eyes:     Conjunctiva/sclera: Conjunctivae normal.  Cardiovascular:     Rate and Rhythm: Normal rate and regular rhythm.     Pulses: Normal pulses.     Heart sounds: Normal heart sounds.  Pulmonary:     Effort: Pulmonary effort is normal.     Breath sounds: Normal breath sounds. No wheezing, rhonchi or rales.  Chest:     Chest wall: No tenderness.  Abdominal:     Palpations: Abdomen  is soft.     Tenderness: There is no abdominal tenderness.  Musculoskeletal:        General: No swelling.     Cervical back: Neck supple.  Skin:    Findings: No rash.  Neurological:     Mental Status: He is alert. Mental status is at baseline.  Psychiatric:        Mood and Affect: Mood normal.     ED Results / Procedures / Treatments   Labs (all labs ordered are listed, but only abnormal results are displayed) Labs Reviewed  BASIC METABOLIC PANEL - Abnormal; Notable for the following components:      Result Value   Glucose, Bld 101 (*)    Creatinine, Ser 1.42 (*)    All other components within normal limits  CBC  TROPONIN I (HIGH SENSITIVITY)  TROPONIN I (HIGH SENSITIVITY)    EKG EKG Interpretation  Date/Time:  Sunday February 04 2020 11:22:47 EDT Ventricular Rate:  54 PR Interval:  154 QRS Duration: 102 QT Interval:  420 QTC Calculation: 398 R Axis:   67 Text Interpretation: Sinus bradycardia Otherwise normal ECG No significant change since last tracing Confirmed by Susy Frizzle [id number]) on 02/04/2020 3:52:33 PM   Radiology DG Chest 2 View  Result Date: 02/04/2020 CLINICAL DATA:  Mid chest pain and mild shortness of breath EXAM: CHEST - 2 VIEW COMPARISON:  07/09/2011 FINDINGS: The heart size and mediastinal contours are within normal limits. Both lungs are clear. The visualized skeletal structures are unremarkable. IMPRESSION: No active cardiopulmonary disease. Electronically Signed   By: Elige Ko   On: 02/04/2020 12:30    Procedures Procedures (including critical care time)  Medications Ordered in ED Medications - No data to display  ED Course  I have reviewed the triage vital signs and the nursing notes.  Pertinent labs & imaging results that were available during my care of the patient were reviewed by me and considered in my medical decision making (see chart for details).    MDM Rules/Calculators/A&P                          BP 130/80 (BP  Location: Right Arm)   Pulse (!) 49   Temp 99 F (37.2 C)   Resp 15   SpO2 99%   Final Clinical Impression(s) / ED Diagnoses Final diagnoses:  Atypical chest pain    Rx / DC Orders ED Discharge Orders    None     4:38 PM Patient here with chest pain atypical of ACS.  His heart score is 1, low risk of Mace.  He is  PERC negative, low suspicion for PE.  Suspect symptoms may be due to reflux.  Work-up essentially unremarkable.  Will provide symptomatic treatment with strict return precaution.  Patient is currently not taking any NSAIDs.  Patient without any concerning symptoms for COVID-19.  He is however unvaccinated therefore encourage patient to get vaccinated.   Fayrene Helper, PA-C 02/04/20 1641    Pollyann Savoy, MD 02/04/20 [phone]

## 2020-02-04 NOTE — ED Notes (Signed)
ED Provider at bedside. 

## 2020-02-04 NOTE — Discharge Instructions (Signed)
You have been evaluated for your chest pain today.  No concerning findings were noted on today's exam.  Your chest pain may be due to heartburn.  Take Prilosec as prescribed.  Return to the ER if you notice progressive worsening shortness of breath, or if you have any other concern.  Please consider getting vaccinated for Covid as well.

## 2020-02-05 ENCOUNTER — Other Ambulatory Visit: Payer: Self-pay

## 2020-02-05 ENCOUNTER — Emergency Department (HOSPITAL_BASED_OUTPATIENT_CLINIC_OR_DEPARTMENT_OTHER): Admission: EM | Admit: 2020-02-05 | Discharge: 2020-02-06 | Discharge status: Absent without leave | Payer: Self-pay

## 2020-02-08 ENCOUNTER — Emergency Department (HOSPITAL_COMMUNITY): Payer: Self-pay

## 2020-02-08 ENCOUNTER — Encounter (HOSPITAL_COMMUNITY): Payer: Self-pay | Admitting: Cardiovascular Disease

## 2020-02-08 ENCOUNTER — Encounter (HOSPITAL_COMMUNITY)
Admission: EM | Disposition: A | Discharge status: Home / Self Care | Payer: Self-pay | Source: Home / Self Care | Attending: Cardiothoracic Surgery

## 2020-02-08 ENCOUNTER — Inpatient Hospital Stay (HOSPITAL_COMMUNITY)
Admission: EM | Admit: 2020-02-08 | Discharge: 2020-02-13 | DRG: 217 | Disposition: A | Discharge status: Home / Self Care | Payer: Self-pay | Attending: Cardiothoracic Surgery | Admitting: Cardiothoracic Surgery

## 2020-02-08 ENCOUNTER — Emergency Department (HOSPITAL_COMMUNITY): Payer: Self-pay | Admitting: Anesthesiology

## 2020-02-08 ENCOUNTER — Other Ambulatory Visit: Payer: Self-pay

## 2020-02-08 DIAGNOSIS — R9431 Abnormal electrocardiogram [ECG] [EKG]: Secondary | ICD-10-CM

## 2020-02-08 DIAGNOSIS — I7102 Dissection of abdominal aorta: Secondary | ICD-10-CM

## 2020-02-08 DIAGNOSIS — I7101 Dissection of thoracic aorta: Principal | ICD-10-CM | POA: Diagnosis present

## 2020-02-08 DIAGNOSIS — Z09 Encounter for follow-up examination after completed treatment for conditions other than malignant neoplasm: Secondary | ICD-10-CM

## 2020-02-08 DIAGNOSIS — F1290 Cannabis use, unspecified, uncomplicated: Secondary | ICD-10-CM | POA: Diagnosis present

## 2020-02-08 DIAGNOSIS — J9811 Atelectasis: Secondary | ICD-10-CM | POA: Diagnosis not present

## 2020-02-08 DIAGNOSIS — Z6837 Body mass index (BMI) 37.0-37.9, adult: Secondary | ICD-10-CM

## 2020-02-08 DIAGNOSIS — I7100 Dissection of unspecified site of aorta: Secondary | ICD-10-CM

## 2020-02-08 DIAGNOSIS — Z9889 Other specified postprocedural states: Secondary | ICD-10-CM

## 2020-02-08 DIAGNOSIS — Z20822 Contact with and (suspected) exposure to covid-19: Secondary | ICD-10-CM | POA: Diagnosis present

## 2020-02-08 DIAGNOSIS — Z419 Encounter for procedure for purposes other than remedying health state, unspecified: Secondary | ICD-10-CM

## 2020-02-08 DIAGNOSIS — D689 Coagulation defect, unspecified: Secondary | ICD-10-CM | POA: Diagnosis not present

## 2020-02-08 DIAGNOSIS — F17200 Nicotine dependence, unspecified, uncomplicated: Secondary | ICD-10-CM

## 2020-02-08 DIAGNOSIS — Z4682 Encounter for fitting and adjustment of non-vascular catheter: Secondary | ICD-10-CM

## 2020-02-08 DIAGNOSIS — R079 Chest pain, unspecified: Secondary | ICD-10-CM

## 2020-02-08 DIAGNOSIS — Z79899 Other long term (current) drug therapy: Secondary | ICD-10-CM

## 2020-02-08 DIAGNOSIS — I71019 Dissection of thoracic aorta, unspecified: Secondary | ICD-10-CM | POA: Diagnosis present

## 2020-02-08 DIAGNOSIS — I10 Essential (primary) hypertension: Secondary | ICD-10-CM | POA: Diagnosis not present

## 2020-02-08 DIAGNOSIS — E669 Obesity, unspecified: Secondary | ICD-10-CM | POA: Diagnosis present

## 2020-02-08 DIAGNOSIS — Z833 Family history of diabetes mellitus: Secondary | ICD-10-CM

## 2020-02-08 DIAGNOSIS — K219 Gastro-esophageal reflux disease without esophagitis: Secondary | ICD-10-CM | POA: Diagnosis present

## 2020-02-08 HISTORY — PX: AORTIC ARCH ANGIOGRAPHY: CATH118224

## 2020-02-08 HISTORY — PX: LEFT HEART CATH AND CORONARY ANGIOGRAPHY: CATH118249

## 2020-02-08 HISTORY — PX: TEE WITHOUT CARDIOVERSION: SHX5443

## 2020-02-08 HISTORY — PX: REPAIR OF ACUTE ASCENDING THORACIC AORTIC DISSECTION: SHX6323

## 2020-02-08 HISTORY — DX: Tobacco use: Z72.0

## 2020-02-08 LAB — BASIC METABOLIC PANEL
Anion gap: 11 (ref 5–15)
BUN: 9 mg/dL (ref 6–20)
CO2: 25 mmol/L (ref 22–32)
Calcium: 9.7 mg/dL (ref 8.9–10.3)
Chloride: 103 mmol/L (ref 98–111)
Creatinine, Ser: 1.45 mg/dL — ABNORMAL HIGH (ref 0.61–1.24)
GFR calc Af Amer: >60 mL/min (ref >60)
GFR calc non Af Amer: >60 mL/min (ref >60)
Glucose, Bld: 112 mg/dL — ABNORMAL HIGH (ref 70–99)
Potassium: 3.8 mmol/L (ref 3.5–5.1)
Sodium: 139 mmol/L (ref 135–145)

## 2020-02-08 LAB — CBC
HCT: 42.7 % (ref 39.0–52.0)
Hemoglobin: 14.6 g/dL (ref 13.0–17.0)
MCH: 32.6 pg (ref 26.0–34.0)
MCHC: 34.2 g/dL (ref 30.0–36.0)
MCV: 95.3 fL (ref 80.0–100.0)
Platelets: 153 10*3/uL (ref 150–400)
RBC: 4.48 MIL/uL (ref 4.22–5.81)
RDW: 12.2 % (ref 11.5–15.5)
WBC: 7.4 10*3/uL (ref 4.0–10.5)
nRBC: 0 % (ref 0.0–0.2)

## 2020-02-08 LAB — POCT I-STAT, CHEM 8
BUN: 12 mg/dL (ref 6–20)
Calcium, Ion: 1.27 mmol/L (ref 1.15–1.40)
Chloride: 104 mmol/L (ref 98–111)
Creatinine, Ser: 1.3 mg/dL — ABNORMAL HIGH (ref 0.61–1.24)
Glucose, Bld: 119 mg/dL — ABNORMAL HIGH (ref 70–99)
HCT: 41 % (ref 39.0–52.0)
Hemoglobin: 13.9 g/dL (ref 13.0–17.0)
Potassium: 3.7 mmol/L (ref 3.5–5.1)
Sodium: 140 mmol/L (ref 135–145)
TCO2: 26 mmol/L (ref 22–32)

## 2020-02-08 LAB — PREPARE RBC (CROSSMATCH)

## 2020-02-08 LAB — TROPONIN I (HIGH SENSITIVITY): Troponin I (High Sensitivity): 4 ng/L (ref <18)

## 2020-02-08 LAB — SARS CORONAVIRUS 2 BY RT PCR (HOSPITAL ORDER, PERFORMED IN ~~LOC~~ HOSPITAL LAB): SARS Coronavirus 2: NEGATIVE

## 2020-02-08 LAB — LIPASE, BLOOD: Lipase: 29 U/L (ref 11–51)

## 2020-02-08 LAB — ABO/RH: ABO/RH(D): B NEG

## 2020-02-08 SURGERY — REPAIR, AORTIC DISSECTION, ASCENDING
Anesthesia: General | Site: Chest

## 2020-02-08 SURGERY — LEFT HEART CATH AND CORONARY ANGIOGRAPHY
Anesthesia: LOCAL

## 2020-02-08 MED ORDER — HEPARIN SODIUM (PORCINE) 5000 UNIT/ML IJ SOLN
4000.0000 [IU] | Freq: Once | INTRAMUSCULAR | Status: AC
  Administered 2020-02-08: 4000 [IU] via INTRAVENOUS
  Filled 2020-02-08: qty 1

## 2020-02-08 MED ORDER — VASOPRESSIN 20 UNIT/ML IV SOLN
INTRAVENOUS | Status: DC | PRN
  Administered 2020-02-08: 1 [IU] via INTRAVENOUS

## 2020-02-08 MED ORDER — NITROGLYCERIN IN D5W 200-5 MCG/ML-% IV SOLN
2.0000 ug/min | INTRAVENOUS | Status: DC
  Filled 2020-02-08: qty 250

## 2020-02-08 MED ORDER — ARTIFICIAL TEARS OPHTHALMIC OINT
TOPICAL_OINTMENT | OPHTHALMIC | Status: AC
  Filled 2020-02-08: qty 3.5

## 2020-02-08 MED ORDER — MIDAZOLAM HCL (PF) 10 MG/2ML IJ SOLN
INTRAMUSCULAR | Status: AC
  Filled 2020-02-08: qty 2

## 2020-02-08 MED ORDER — SUCCINYLCHOLINE CHLORIDE 200 MG/10ML IV SOSY
PREFILLED_SYRINGE | INTRAVENOUS | Status: AC
  Filled 2020-02-08: qty 10

## 2020-02-08 MED ORDER — PROPOFOL 10 MG/ML IV BOLUS
INTRAVENOUS | Status: AC
  Filled 2020-02-08: qty 20

## 2020-02-08 MED ORDER — TRANEXAMIC ACID 1000 MG/10ML IV SOLN
1.5000 mg/kg/h | INTRAVENOUS | Status: DC
  Administered 2020-02-09: 1.5 mg/kg/h via INTRAVENOUS
  Filled 2020-02-08: qty 25

## 2020-02-08 MED ORDER — TRANEXAMIC ACID (OHS) PUMP PRIME SOLUTION
2.0000 mg/kg | INTRAVENOUS | Status: DC
  Filled 2020-02-08: qty 2.68

## 2020-02-08 MED ORDER — HEPARIN (PORCINE) IN NACL 1000-0.9 UT/500ML-% IV SOLN
INTRAVENOUS | Status: DC | PRN
  Administered 2020-02-08 (×2): 500 mL

## 2020-02-08 MED ORDER — PLASMA-LYTE 148 IV SOLN
INTRAVENOUS | Status: DC
  Filled 2020-02-08: qty 2.5

## 2020-02-08 MED ORDER — SUFENTANIL CITRATE 50 MCG/ML IV SOLN
INTRAVENOUS | Status: DC | PRN
Start: 2020-02-08 — End: 2020-02-09
  Administered 2020-02-08: 20 ug via INTRAVENOUS
  Administered 2020-02-08: 10 ug via INTRAVENOUS
  Administered 2020-02-08 (×3): 20 ug via INTRAVENOUS
  Administered 2020-02-08: 50 ug via INTRAVENOUS
  Administered 2020-02-08: 30 ug via INTRAVENOUS
  Administered 2020-02-08 (×2): 20 ug via INTRAVENOUS
  Administered 2020-02-09 (×4): 10 ug via INTRAVENOUS

## 2020-02-08 MED ORDER — HEPARIN SODIUM (PORCINE) 1000 UNIT/ML IJ SOLN
INTRAMUSCULAR | Status: AC
  Filled 2020-02-08: qty 1

## 2020-02-08 MED ORDER — SUFENTANIL CITRATE 250 MCG/5ML IV SOLN
INTRAVENOUS | Status: AC
  Filled 2020-02-08: qty 5

## 2020-02-08 MED ORDER — PROTAMINE SULFATE 10 MG/ML IV SOLN
INTRAVENOUS | Status: AC
  Filled 2020-02-08: qty 5

## 2020-02-08 MED ORDER — LIDOCAINE HCL (PF) 1 % IJ SOLN
INTRAMUSCULAR | Status: AC
  Filled 2020-02-08: qty 30

## 2020-02-08 MED ORDER — HEPARIN (PORCINE) IN NACL 1000-0.9 UT/500ML-% IV SOLN
INTRAVENOUS | Status: AC
  Filled 2020-02-08: qty 1000

## 2020-02-08 MED ORDER — HEPARIN SODIUM (PORCINE) 1000 UNIT/ML IJ SOLN: INTRAMUSCULAR | Status: DC | PRN

## 2020-02-08 MED ORDER — FENTANYL CITRATE (PF) 100 MCG/2ML IJ SOLN
INTRAMUSCULAR | Status: DC | PRN
  Administered 2020-02-08: 50 ug via INTRAVENOUS

## 2020-02-08 MED ORDER — ESMOLOL HCL 100 MG/10ML IV SOLN
INTRAVENOUS | Status: DC | PRN
  Administered 2020-02-08: 30 mg via INTRAVENOUS
  Administered 2020-02-08: 20 mg via INTRAVENOUS

## 2020-02-08 MED ORDER — MIDAZOLAM HCL 2 MG/2ML IJ SOLN
INTRAMUSCULAR | Status: DC | PRN
  Administered 2020-02-08: 2 mg via INTRAVENOUS

## 2020-02-08 MED ORDER — SODIUM CHLORIDE 0.9 % IV SOLN
INTRAVENOUS | Status: DC
  Filled 2020-02-08: qty 30

## 2020-02-08 MED ORDER — SODIUM CHLORIDE 0.9 % IV SOLN
20.0000 ug | INTRAVENOUS | Status: AC
  Administered 2020-02-09: 20 ug via INTRAVENOUS
  Filled 2020-02-08: qty 5

## 2020-02-08 MED ORDER — SODIUM CHLORIDE 0.9 % IV SOLN
1.5000 g | INTRAVENOUS | Status: AC
  Administered 2020-02-08: 1.5 g via INTRAVENOUS
  Filled 2020-02-08: qty 1.5

## 2020-02-08 MED ORDER — PLASMA-LYTE 148 IV SOLN
INTRAVENOUS | Status: DC | PRN
  Administered 2020-02-08: 500 mL

## 2020-02-08 MED ORDER — POTASSIUM CHLORIDE 2 MEQ/ML IV SOLN
80.0000 meq | INTRAVENOUS | Status: DC
  Filled 2020-02-08: qty 40

## 2020-02-08 MED ORDER — HEMOSTATIC AGENTS (NO CHARGE) OPTIME
TOPICAL | Status: DC | PRN
  Administered 2020-02-08 – 2020-02-09 (×9): 1 via TOPICAL

## 2020-02-08 MED ORDER — PROTAMINE SULFATE 10 MG/ML IV SOLN
INTRAVENOUS | Status: AC
  Filled 2020-02-08: qty 25

## 2020-02-08 MED ORDER — IOHEXOL 350 MG/ML SOLN
INTRAVENOUS | Status: DC | PRN
  Administered 2020-02-08: 90 mL via INTRA_ARTERIAL

## 2020-02-08 MED ORDER — VANCOMYCIN HCL 1500 MG/300ML IV SOLN
1500.0000 mg | INTRAVENOUS | Status: AC
  Administered 2020-02-08: 1500 mg via INTRAVENOUS
  Filled 2020-02-08 (×2): qty 300

## 2020-02-08 MED ORDER — ASPIRIN 81 MG PO CHEW
CHEWABLE_TABLET | ORAL | Status: AC
  Filled 2020-02-08: qty 4

## 2020-02-08 MED ORDER — NITROGLYCERIN 1 MG/10 ML FOR IR/CATH LAB
INTRA_ARTERIAL | Status: AC
  Filled 2020-02-08: qty 10

## 2020-02-08 MED ORDER — LIDOCAINE 2% (20 MG/ML) 5 ML SYRINGE
INTRAMUSCULAR | Status: DC | PRN
  Administered 2020-02-08: 100 mg via INTRAVENOUS

## 2020-02-08 MED ORDER — ETOMIDATE 2 MG/ML IV SOLN
INTRAVENOUS | Status: AC
  Filled 2020-02-08: qty 10

## 2020-02-08 MED ORDER — LACTATED RINGERS IV SOLN: INTRAVENOUS | Status: DC | PRN

## 2020-02-08 MED ORDER — PHENYLEPHRINE HCL-NACL 20-0.9 MG/250ML-% IV SOLN
30.0000 ug/min | INTRAVENOUS | Status: AC
  Administered 2020-02-08: 25 ug/min via INTRAVENOUS
  Filled 2020-02-08: qty 250

## 2020-02-08 MED ORDER — MIDAZOLAM HCL 5 MG/5ML IJ SOLN
INTRAMUSCULAR | Status: DC | PRN
  Administered 2020-02-08: 6 mg via INTRAVENOUS
  Administered 2020-02-09: 4 mg via INTRAVENOUS

## 2020-02-08 MED ORDER — IOHEXOL 350 MG/ML SOLN
INTRAVENOUS | Status: AC
  Filled 2020-02-08: qty 1

## 2020-02-08 MED ORDER — TRANEXAMIC ACID (OHS) BOLUS VIA INFUSION
15.0000 mg/kg | INTRAVENOUS | Status: AC
  Administered 2020-02-08: 2007 mg via INTRAVENOUS
  Filled 2020-02-08: qty 2007

## 2020-02-08 MED ORDER — HEPARIN SODIUM (PORCINE) 1000 UNIT/ML IJ SOLN
INTRAMUSCULAR | Status: DC | PRN
  Administered 2020-02-08: 4000 [IU] via INTRAVENOUS
  Administered 2020-02-08: 41000 [IU] via INTRAVENOUS
  Administered 2020-02-08: 2000 [IU] via INTRAVENOUS

## 2020-02-08 MED ORDER — FENTANYL CITRATE (PF) 100 MCG/2ML IJ SOLN
INTRAMUSCULAR | Status: AC
  Filled 2020-02-08: qty 2

## 2020-02-08 MED ORDER — MILRINONE LACTATE IN DEXTROSE 20-5 MG/100ML-% IV SOLN
0.3000 ug/kg/min | INTRAVENOUS | Status: AC
  Administered 2020-02-08: .25 ug/kg/min via INTRAVENOUS
  Filled 2020-02-08: qty 100

## 2020-02-08 MED ORDER — DEXMEDETOMIDINE HCL IN NACL 400 MCG/100ML IV SOLN
0.1000 ug/kg/h | INTRAVENOUS | Status: AC
  Administered 2020-02-08: .2 ug/kg/h via INTRAVENOUS
  Filled 2020-02-08 (×2): qty 100

## 2020-02-08 MED ORDER — METOPROLOL TARTRATE 12.5 MG HALF TABLET: 12.5000 mg | ORAL_TABLET | Freq: Once | ORAL | Status: DC

## 2020-02-08 MED ORDER — 0.9 % SODIUM CHLORIDE (POUR BTL) OPTIME
TOPICAL | Status: DC | PRN
  Administered 2020-02-08: 5000 mL

## 2020-02-08 MED ORDER — NOREPINEPHRINE 4 MG/250ML-% IV SOLN
0.0000 ug/min | INTRAVENOUS | Status: AC
  Administered 2020-02-08: 6 ug/min via INTRAVENOUS
  Filled 2020-02-08: qty 250

## 2020-02-08 MED ORDER — HYDROCORTISONE NA SUCCINATE PF 250 MG IJ SOLR
INTRAMUSCULAR | Status: AC
  Filled 2020-02-08: qty 250

## 2020-02-08 MED ORDER — VERAPAMIL HCL 2.5 MG/ML IV SOLN
INTRAVENOUS | Status: AC
  Filled 2020-02-08: qty 2

## 2020-02-08 MED ORDER — LIDOCAINE HCL (PF) 1 % IJ SOLN
INTRAMUSCULAR | Status: DC | PRN
  Administered 2020-02-08: 2 mL via INTRADERMAL

## 2020-02-08 MED ORDER — PHENYLEPHRINE 40 MCG/ML (10ML) SYRINGE FOR IV PUSH (FOR BLOOD PRESSURE SUPPORT)
PREFILLED_SYRINGE | INTRAVENOUS | Status: AC
  Filled 2020-02-08: qty 10

## 2020-02-08 MED ORDER — EPHEDRINE 5 MG/ML INJ
INTRAVENOUS | Status: AC
  Filled 2020-02-08: qty 10

## 2020-02-08 MED ORDER — PROPOFOL 10 MG/ML IV BOLUS
INTRAVENOUS | Status: DC | PRN
  Administered 2020-02-08: 120 mg via INTRAVENOUS
  Administered 2020-02-08: 150 mg via INTRAVENOUS

## 2020-02-08 MED ORDER — LIDOCAINE 2% (20 MG/ML) 5 ML SYRINGE
INTRAMUSCULAR | Status: AC
  Filled 2020-02-08: qty 5

## 2020-02-08 MED ORDER — VERAPAMIL HCL 2.5 MG/ML IV SOLN
INTRAVENOUS | Status: DC | PRN
  Administered 2020-02-08: 10 mL via INTRA_ARTERIAL

## 2020-02-08 MED ORDER — MIDAZOLAM HCL 2 MG/2ML IJ SOLN
INTRAMUSCULAR | Status: AC
  Filled 2020-02-08: qty 2

## 2020-02-08 MED ORDER — VASOPRESSIN 20 UNIT/ML IV SOLN
INTRAVENOUS | Status: AC
  Filled 2020-02-08: qty 1

## 2020-02-08 MED ORDER — INSULIN REGULAR(HUMAN) IN NACL 100-0.9 UT/100ML-% IV SOLN
INTRAVENOUS | Status: AC
  Administered 2020-02-08: .9 [IU]/h via INTRAVENOUS
  Filled 2020-02-08: qty 100

## 2020-02-08 MED ORDER — CHLORHEXIDINE GLUCONATE CLOTH 2 % EX PADS: 6.0000 | MEDICATED_PAD | Freq: Once | CUTANEOUS | Status: DC

## 2020-02-08 MED ORDER — HEPARIN SODIUM (PORCINE) 1000 UNIT/ML IJ SOLN
INTRAMUSCULAR | Status: DC | PRN
  Administered 2020-02-08: 3000 [IU] via INTRAVENOUS

## 2020-02-08 MED ORDER — HYDROCORTISONE NA SUCCINATE PF 100 MG IJ SOLR
INTRAMUSCULAR | Status: DC | PRN
  Administered 2020-02-08: 125 mg via INTRAVENOUS

## 2020-02-08 MED ORDER — CHLORHEXIDINE GLUCONATE 0.12 % MT SOLN: 15.0000 mL | Freq: Once | OROMUCOSAL | Status: DC

## 2020-02-08 MED ORDER — TRANEXAMIC ACID 1000 MG/10ML IV SOLN
1.5000 mg/kg/h | INTRAVENOUS | Status: AC
  Administered 2020-02-08: 1.5 mg/kg/h via INTRAVENOUS
  Filled 2020-02-08: qty 25

## 2020-02-08 MED ORDER — VECURONIUM BROMIDE 10 MG IV SOLR
INTRAVENOUS | Status: DC | PRN
  Administered 2020-02-08: 2 mg via INTRAVENOUS
  Administered 2020-02-08: 3 mg via INTRAVENOUS
  Administered 2020-02-08: 5 mg via INTRAVENOUS

## 2020-02-08 MED ORDER — MANNITOL 20 % IV SOLN
Freq: Once | INTRAVENOUS | Status: DC
  Filled 2020-02-08: qty 13

## 2020-02-08 MED ORDER — ROCURONIUM BROMIDE 10 MG/ML (PF) SYRINGE
PREFILLED_SYRINGE | INTRAVENOUS | Status: AC
  Filled 2020-02-08: qty 30

## 2020-02-08 MED ORDER — SODIUM CHLORIDE (PF) 0.9 % IJ SOLN
INTRAMUSCULAR | Status: AC
  Filled 2020-02-08: qty 10

## 2020-02-08 MED ORDER — SODIUM CHLORIDE 0.9 % IV SOLN
750.0000 mg | INTRAVENOUS | Status: AC
  Administered 2020-02-09: 750 mg via INTRAVENOUS
  Filled 2020-02-08: qty 750

## 2020-02-08 MED ORDER — ROCURONIUM BROMIDE 100 MG/10ML IV SOLN
INTRAVENOUS | Status: DC | PRN
  Administered 2020-02-08 (×2): 100 mg via INTRAVENOUS
  Administered 2020-02-09: 50 mg via INTRAVENOUS

## 2020-02-08 MED ORDER — VECURONIUM BROMIDE 10 MG IV SOLR
INTRAVENOUS | Status: AC
  Filled 2020-02-08: qty 10

## 2020-02-08 MED ORDER — EPINEPHRINE HCL 5 MG/250ML IV SOLN IN NS
0.0000 ug/min | INTRAVENOUS | Status: DC
  Filled 2020-02-08: qty 250

## 2020-02-08 MED ORDER — MAGNESIUM SULFATE 50 % IJ SOLN
40.0000 meq | INTRAMUSCULAR | Status: DC
  Filled 2020-02-08: qty 9.85

## 2020-02-08 SURGICAL SUPPLY — 12 items
CATH 5FR JL3.5 JR4 ANG PIG MP (CATHETERS) ×2 IMPLANT
CATH INFINITI 5 FR 3DRC (CATHETERS) ×2 IMPLANT
CATH INFINITI 5FR AL1 (CATHETERS) ×2 IMPLANT
DEVICE RAD COMP TR BAND LRG (VASCULAR PRODUCTS) ×2 IMPLANT
GLIDESHEATH SLEND SS 6F .021 (SHEATH) ×2 IMPLANT
GUIDEWIRE INQWIRE 1.5J.035X260 (WIRE) ×1 IMPLANT
INQWIRE 1.5J .035X260CM (WIRE) ×2
KIT HEART LEFT (KITS) ×2 IMPLANT
PACK CARDIAC CATHETERIZATION (CUSTOM PROCEDURE TRAY) ×2 IMPLANT
SYR MEDRAD MARK 7 150ML (SYRINGE) ×2 IMPLANT
TRANSDUCER W/STOPCOCK (MISCELLANEOUS) ×2 IMPLANT
TUBING CIL FLEX 10 FLL-RA (TUBING) ×2 IMPLANT

## 2020-02-08 SURGICAL SUPPLY — 124 items
ADAPTER CARDIO PERF ANTE/RETRO (ADAPTER) ×4 IMPLANT
APPLICATOR TIP COSEAL (VASCULAR PRODUCTS) ×4 IMPLANT
ATTRACTOMAT 16X20 MAGNETIC DRP (DRAPES) ×2 IMPLANT
BAG DECANTER FOR FLEXI CONT (MISCELLANEOUS) ×4 IMPLANT
BLADE CLIPPER SURG (BLADE) ×4 IMPLANT
BLADE STERNUM SYSTEM 6 (BLADE) ×4 IMPLANT
BLADE SURG 12 STRL SS (BLADE) ×2 IMPLANT
BLADE SURG 15 STRL LF DISP TIS (BLADE) ×2 IMPLANT
BLADE SURG 15 STRL SS (BLADE) ×4
CANISTER SUCT 3000ML PPV (MISCELLANEOUS) ×4 IMPLANT
CANNULA AORTIC ROOT 9FR (CANNULA) ×2 IMPLANT
CANNULA GUNDRY RCSP 15FR (MISCELLANEOUS) ×4 IMPLANT
CANNULA MC2 2 STG 36/46 NON-V (CANNULA) IMPLANT
CANNULA SUMP PERICARDIAL (CANNULA) ×2 IMPLANT
CANNULA VENOUS 2 STG 34/46 (CANNULA) ×4
CATH HEART VENT LEFT (CATHETERS) ×2 IMPLANT
CATH ROBINSON RED A/P 18FR (CATHETERS) ×14 IMPLANT
CATH THORACIC 28FR RT ANG (CATHETERS) ×6 IMPLANT
CATH THORACIC 36FR (CATHETERS) ×2 IMPLANT
CAUTERY SURG HI TEMP FINE TIP (MISCELLANEOUS) ×2 IMPLANT
CNTNR URN SCR LID CUP LEK RST (MISCELLANEOUS) ×2 IMPLANT
CONN 3/8X3/8 GISH STERILE (MISCELLANEOUS) ×2 IMPLANT
CONN ST 1/4X3/8  BEN (MISCELLANEOUS) ×12
CONN ST 1/4X3/8 BEN (MISCELLANEOUS) IMPLANT
CONN Y 3/8X3/8X3/8  BEN (MISCELLANEOUS) ×4
CONN Y 3/8X3/8X3/8 BEN (MISCELLANEOUS) IMPLANT
CONT SPEC 4OZ STRL OR WHT (MISCELLANEOUS) ×4
COVER SURGICAL LIGHT HANDLE (MISCELLANEOUS) ×4 IMPLANT
DRAIN CHANNEL 32F RND 10.7 FF (WOUND CARE) ×2 IMPLANT
DRAPE INCISE IOBAN 66X45 STRL (DRAPES) ×2 IMPLANT
DRAPE SLUSH/WARMER DISC (DRAPES) IMPLANT
DRSG AQUACEL AG ADV 3.5X 6 (GAUZE/BANDAGES/DRESSINGS) ×2 IMPLANT
DRSG AQUACEL AG ADV 3.5X14 (GAUZE/BANDAGES/DRESSINGS) ×2 IMPLANT
DRSG COVADERM 4X14 (GAUZE/BANDAGES/DRESSINGS) ×4 IMPLANT
ELECT CAUTERY BLADE 6.4 (BLADE) ×4 IMPLANT
ELECT REM PT RETURN 9FT ADLT (ELECTROSURGICAL) ×8
ELECTRODE REM PT RTRN 9FT ADLT (ELECTROSURGICAL) ×4 IMPLANT
FELT TEFLON 6X6 (MISCELLANEOUS) ×2 IMPLANT
GAUZE SPONGE 4X4 12PLY STRL (GAUZE/BANDAGES/DRESSINGS) ×4 IMPLANT
GAUZE SPONGE 4X4 12PLY STRL LF (GAUZE/BANDAGES/DRESSINGS) ×2 IMPLANT
GLOVE BIO SURGEON STRL SZ 6 (GLOVE) IMPLANT
GLOVE BIO SURGEON STRL SZ 6.5 (GLOVE) ×9 IMPLANT
GLOVE BIO SURGEON STRL SZ7 (GLOVE) IMPLANT
GLOVE BIO SURGEON STRL SZ7.5 (GLOVE) ×6 IMPLANT
GLOVE BIO SURGEONS STRL SZ 6.5 (GLOVE) ×9
GLOVE BIOGEL PI IND STRL 6 (GLOVE) IMPLANT
GLOVE BIOGEL PI IND STRL 6.5 (GLOVE) IMPLANT
GLOVE BIOGEL PI IND STRL 7.0 (GLOVE) IMPLANT
GLOVE BIOGEL PI IND STRL 7.5 (GLOVE) IMPLANT
GLOVE BIOGEL PI INDICATOR 6 (GLOVE) ×2
GLOVE BIOGEL PI INDICATOR 6.5 (GLOVE) ×8
GLOVE BIOGEL PI INDICATOR 7.0 (GLOVE) ×4
GLOVE BIOGEL PI INDICATOR 7.5 (GLOVE) ×2
GLOVE EUDERMIC 7 POWDERFREE (GLOVE) ×8 IMPLANT
GOWN STRL REUS W/ TWL LRG LVL3 (GOWN DISPOSABLE) ×8 IMPLANT
GOWN STRL REUS W/TWL LRG LVL3 (GOWN DISPOSABLE) ×52
GRAFT GELWEAVE IMPREG 10X30CM (Prosthesis & Implant Heart) ×2 IMPLANT
GRAFT VASC 8MMX60CM STRAIGHT (Prosthesis & Implant Heart) ×2 IMPLANT
GRAFT WOVEN D/V 32DX30L (Vascular Products) ×2 IMPLANT
HANDLE STAPLE ENDO GIA SHORT (STAPLE) ×2
HEMOSTAT POWDER SURGIFOAM 1G (HEMOSTASIS) ×12 IMPLANT
HEMOSTAT SURGICEL 2X14 (HEMOSTASIS) ×4 IMPLANT
INSERT FOGARTY SM (MISCELLANEOUS) ×4 IMPLANT
INSERT FOGARTY XLG (MISCELLANEOUS) ×2 IMPLANT
KIT BASIN OR (CUSTOM PROCEDURE TRAY) ×4 IMPLANT
KIT CATH CPB BARTLE (MISCELLANEOUS) ×4 IMPLANT
KIT SUCTION CATH 14FR (SUCTIONS) ×4 IMPLANT
KIT TURNOVER KIT B (KITS) ×4 IMPLANT
LINE VENT (MISCELLANEOUS) ×2 IMPLANT
LOOP VESSEL MAXI BLUE (MISCELLANEOUS) ×2 IMPLANT
NS IRRIG 1000ML POUR BTL (IV SOLUTION) ×20 IMPLANT
PACK E OPEN HEART (SUTURE) ×4 IMPLANT
PACK OPEN HEART (CUSTOM PROCEDURE TRAY) ×4 IMPLANT
PAD ARMBOARD 7.5X6 YLW CONV (MISCELLANEOUS) ×8 IMPLANT
POSITIONER HEAD DONUT 9IN (MISCELLANEOUS) ×4 IMPLANT
POWDER SURGICEL 3.0 GRAM (HEMOSTASIS) ×2 IMPLANT
RELOAD TRI 2.0 30 VAS MED SUL (STAPLE) ×4 IMPLANT
SEALANT PATCH FIBRIN 2X4IN (MISCELLANEOUS) ×2 IMPLANT
SEALANT SURG COSEAL 8ML (VASCULAR PRODUCTS) ×2 IMPLANT
SET CARDIOPLEGIA MPS 5001102 (MISCELLANEOUS) ×2 IMPLANT
SET VEIN GRAFT PERF (SET/KITS/TRAYS/PACK) ×2 IMPLANT
SPONGE LAP 18X18 RF (DISPOSABLE) ×4 IMPLANT
SPONGE LAP 4X18 RFD (DISPOSABLE) ×2 IMPLANT
STAPLER ENDO GIA 12 SHRT THIN (STAPLE) IMPLANT
STAPLER ENDO GIA 12MM SHORT (STAPLE) ×2 IMPLANT
STAPLER VISISTAT 35W (STAPLE) ×2 IMPLANT
SURGIFLO W/THROMBIN 8M KIT (HEMOSTASIS) ×2 IMPLANT
SUT BONE WAX W31G (SUTURE) ×4 IMPLANT
SUT ETHIBON 2 0 V 52N 30 (SUTURE) ×8 IMPLANT
SUT ETHIBON EXCEL 2-0 V-5 (SUTURE) IMPLANT
SUT ETHIBOND V-5 VALVE (SUTURE) IMPLANT
SUT PROLENE 3 0 RB 1 (SUTURE) ×2 IMPLANT
SUT PROLENE 3 0 SH 1 (SUTURE) ×4 IMPLANT
SUT PROLENE 3 0 SH DA (SUTURE) ×8 IMPLANT
SUT PROLENE 4 0 RB 1 (SUTURE) ×140
SUT PROLENE 4 0 SH DA (SUTURE) ×20 IMPLANT
SUT PROLENE 4-0 RB1 .5 CRCL 36 (SUTURE) ×8 IMPLANT
SUT PROLENE 4-0 RB1 18X2 ARM (SUTURE) IMPLANT
SUT PROLENE 5 0 C 1 36 (SUTURE) ×20 IMPLANT
SUT PROLENE 5 0 RB 2 (SUTURE) ×4 IMPLANT
SUT PROLENE 6 0 C 1 30 (SUTURE) ×4 IMPLANT
SUT PROLENE 6 0 CC (SUTURE) ×14 IMPLANT
SUT STEEL 6MS V (SUTURE) IMPLANT
SUT STEEL STERNAL CCS#1 18IN (SUTURE) ×4 IMPLANT
SUT STEEL SZ 6 DBL 3X14 BALL (SUTURE) ×2 IMPLANT
SUT VIC AB 1 CTX 18 (SUTURE) ×2 IMPLANT
SUT VIC AB 1 CTX 27 (SUTURE) ×2 IMPLANT
SUT VIC AB 1 CTX 36 (SUTURE) ×8
SUT VIC AB 1 CTX36XBRD ANBCTR (SUTURE) ×4 IMPLANT
SUT VIC AB 2-0 CT1 27 (SUTURE) ×4
SUT VIC AB 2-0 CT1 TAPERPNT 27 (SUTURE) IMPLANT
SUT VIC AB 3-0 X1 27 (SUTURE) IMPLANT
SYR 10ML KIT SKIN ADHESIVE (MISCELLANEOUS) ×2 IMPLANT
SYSTEM SAHARA CHEST DRAIN ATS (WOUND CARE) ×4 IMPLANT
TAPE CLOTH SURG 4X10 WHT LF (GAUZE/BANDAGES/DRESSINGS) ×2 IMPLANT
TAPE PAPER 2X10 WHT MICROPORE (GAUZE/BANDAGES/DRESSINGS) ×2 IMPLANT
TOWEL GREEN STERILE (TOWEL DISPOSABLE) ×4 IMPLANT
TOWEL GREEN STERILE FF (TOWEL DISPOSABLE) ×4 IMPLANT
TRAY FOLEY SLVR 14FR TEMP STAT (SET/KITS/TRAYS/PACK) ×2 IMPLANT
TRAY FOLEY SLVR 16FR TEMP STAT (SET/KITS/TRAYS/PACK) ×2 IMPLANT
TUBE SUCT INTRACARD DLP 20F (MISCELLANEOUS) ×2 IMPLANT
UNDERPAD 30X36 HEAVY ABSORB (UNDERPADS AND DIAPERS) ×4 IMPLANT
VENT LEFT HEART 12002 (CATHETERS) ×8
WATER STERILE IRR 1000ML POUR (IV SOLUTION) ×8 IMPLANT

## 2020-02-08 NOTE — Brief Op Note (Signed)
02/08/2020  9:51 AM  PATIENT:  [name]  34 y.o. 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 (N/A) TRANSESOPHAGEAL ECHOCARDIOGRAM (TEE) (N/A)  SURGEON:  Surgeon(s) and Role: Panel 1:    * Kerin Perna, MD - Primary Panel 2:    * Kerin Perna, MD - Primary  PHYSICIAN ASSISTANT: Gustin Zobrist PA-C  ANESTHESIA:   general  EBL:  2000 mL   BLOOD ADMINISTERED: PRBC'S , CRYO, FFP,PLATELETS  DRAINS: MEDIASTINAL    LOCAL MEDICATIONS USED:  NONE  SPECIMEN:  Source of Specimen:  AORTIC DISSECTION  DISPOSITION OF SPECIMEN:  PATHOLOGY  COUNTS:  YES  TOURNIQUET:  * No tourniquets in log *  DICTATION: PENDING  PLAN OF CARE: Admit to inpatient   PATIENT DISPOSITION:  ICU - intubated and hemodynamically stable.   Delay start of Pharmacological VTE agent (>24hrs) due to surgical blood loss or risk of bleeding: yes  COMPLICATIONS: NO KNOWN

## 2020-02-08 NOTE — ED Notes (Signed)
Stemi doctor paged to Dr. Juleen China per his request.

## 2020-02-08 NOTE — Anesthesia Procedure Notes (Signed)
Arterial Line Insertion Start/End8/19/2021 4:30 PM, 02/08/2020 4:35 PM Performed by: Alease Medina, CRNA, CRNA  Preanesthetic checklist: patient identified, IV checked, site marked, risks and benefits discussed, surgical consent, monitors and equipment checked, pre-op evaluation, timeout performed and anesthesia consent Lidocaine 1% used for infiltration and patient sedated Left, radial was placed Catheter size: 20 G Hand hygiene performed  and maximum sterile barriers used  Allen's test indicative of satisfactory collateral circulation Attempts: 1 Procedure performed without using ultrasound guided technique. Following insertion, dressing applied and Biopatch. Post procedure assessment: normal  Patient tolerated the procedure well with no immediate complications.

## 2020-02-08 NOTE — H&P (Signed)
Cardiology Admission History and Physical:   Patient ID: Aaron [name] MRN: [id number]; DOB: Dec 01, 1985   Admission date: 02/08/2020  Primary Care Provider: Patient, No Pcp Per [facility] Cardiologist: No primary care provider on file.  [facility] Electrophysiologist:  None   Chief Complaint:  Chest pain, abdominal pain, nausea  Patient Profile:   Aaron [name] is a 34 y.o. male with history of obesity, tobacco abuse, marijuana abuse presenting to the ED with c/o chest and abdominal pain with nausea. EKG with ST elevation in the lateral, inferior and anterolateral leads. He has been having on/of chest pain for the past 5 days. He was seen in the ED on 02/04/20 with similar complaints and his EKG was normal. Troponin was normal at that time x 2. He has continued to have chest and abdominal/epigastric pain since then. No relief with therapy for GERD.   History of Present Illness:   Aaron [name] is a 34 y.o. male with history of obesityk, tobacco abuse, marijuana abuse presenting to the ED with c/o chest and abdominal pain with nausea. EKG with ST elevation in the lateral, inferior and anterolateral leads. He has been having on/of chest pain for the past 5 days. He was seen in the ED on 02/04/20 with similar complaints and his EKG was normal. Troponin was normal at that time x 2. He has continued to have chest and abdominal/epigastric pain since then. No relief with therapy for GERD. He has had back pain since the ambulance drive over from [facility].   Past Medical History:  Diagnosis Date  . Tobacco abuse     Past Surgical History:  Procedure Laterality Date  . FRACTURE SURGERY    . HAND SURGERY    . HERNIA REPAIR       Medications Prior to Admission: Prior to Admission medications   Medication Sig Start Date End Date Taking? Authorizing Provider  amoxicillin (AMOXIL) 500 MG capsule Take 1 capsule (500 mg total) by mouth 3 (three) times daily. 04/02/17    Joni Reining, PA-C  benzocaine (ORAJEL) 10 % mucosal gel Use as directed 1 application in the mouth or throat as needed.    [provider]  calcium carbonate (TUMS - DOSED IN MG ELEMENTAL CALCIUM) 500 MG chewable tablet Chew 1,500 tablets by mouth once as needed. For indigestion    [provider]  cetirizine (ZYRTEC ALLERGY) 10 MG tablet Take 1 tablet (10 mg total) by mouth daily. 10/04/18   Law, Waylan Boga, PA-C  fluticasone (FLONASE) 50 MCG/ACT nasal spray Place 1 spray into both nostrils daily. 10/04/18   Law, Waylan Boga, PA-C  ibuprofen (ADVIL,MOTRIN) 600 MG tablet Take 1 tablet (600 mg total) by mouth every 6 (six) hours as needed for mild pain or moderate pain. 09/03/17   Antony Madura, PA-C  lidocaine (XYLOCAINE) 2 % solution Use as directed 5 mLs in the mouth or throat every 6 (six) hours as needed for mouth pain. 04/02/17   Joni Reining, PA-C  omeprazole (PRILOSEC) 20 MG capsule Take 1 capsule (20 mg total) by mouth 2 (two) times daily before a meal. 02/04/20   Fayrene Helper, PA-C  traMADol (ULTRAM) 50 MG tablet Take 1 tablet (50 mg total) by mouth every 8 (eight) hours as needed for severe pain. 09/03/17   Antony Madura, PA-C     Allergies:   No Known Allergies  Social History:   Social History   Socioeconomic History  . Marital status: Single  Spouse name: Not on file  . Number of children: Not on file  . Years of education: Not on file  . Highest education level: Not on file  Occupational History  . Not on file  Tobacco Use  . Smoking status: Current Every Day Smoker  . Smokeless tobacco: Never Used  Vaping Use  . Vaping Use: Never used  Substance and Sexual Activity  . Alcohol use: No  . Drug use: Yes    Types: Marijuana  . Sexual activity: Not on file  Other Topics Concern  . Not on file  Social History Narrative  . Not on file   Social Determinants of Health   Financial Resource Strain:   . Difficulty of Paying Living Expenses: Not on file   Food Insecurity:   . Worried About Programme researcher, broadcasting/film/video in the Last Year: Not on file  . Ran Out of Food in the Last Year: Not on file  Transportation Needs:   . Lack of Transportation (Medical): Not on file  . Lack of Transportation (Non-Medical): Not on file  Physical Activity:   . Days of Exercise per Week: Not on file  . Minutes of Exercise per Session: Not on file  Stress:   . Feeling of Stress : Not on file  Social Connections:   . Frequency of Communication with Friends and Family: Not on file  . Frequency of Social Gatherings with Friends and Family: Not on file  . Attends Religious Services: Not on file  . Active Member of Clubs or Organizations: Not on file  . Attends Banker Meetings: Not on file  . Marital Status: Not on file  Intimate Partner Violence:   . Fear of Current or Ex-Partner: Not on file  . Emotionally Abused: Not on file  . Physically Abused: Not on file  . Sexually Abused: Not on file    Family History:   The patient's family history includes Diabetes in his mother.    ROS:  Please see the history of present illness.  All other ROS reviewed and negative.     Physical Exam/Data:   Vitals:   02/08/20 1446  BP: (!) 144/85  Pulse: 61  Resp: 20  SpO2: 100%   No intake or output data in the 24 hours ending 02/08/20 1501 Last 3 Weights 10/04/2018 06/13/2018 09/03/2017  Weight (lbs) 290 lb 291 lb 3 oz 279 lb  Weight (kg) 131.543 kg 132.082 kg 126.554 kg     There is no height or weight on file to calculate BMI.  General:  Well nourished, well developed, overall appears comfortable HEENT: normal Lymph: no adenopathy Neck: no JVD Endocrine:  No thryomegaly Vascular: No carotid bruits; FA pulses 2+ bilaterally without bruits  Cardiac:  normal S1, S2; RRR; no murmur  Lungs:  clear to auscultation bilaterally, no wheezing, rhonchi or rales  Abd: soft, nontender, no hepatomegaly  Ext: no LE edema Musculoskeletal:  No deformities, BUE and  BLE strength normal and equal Skin: warm and dry  Neuro:  CNs 2-12 intact, no focal abnormalities noted Psych:  Normal affect    EKG:  The ECG that was done was personally reviewed and demonstrates sinus rhythm with ST elevation in the inferior, lateral and anterolateral leads. PR depression.   Relevant CV Studies:   Laboratory Data:  High Sensitivity Troponin:   Recent Labs  Lab 02/04/20 1213 02/04/20 1530  TROPONINIHS 5 4      Chemistry Recent Labs  Lab 02/04/20 1213  NA 137  K 4.0  CL 100  CO2 28  GLUCOSE 101*  BUN 9  CREATININE 1.42*  CALCIUM 9.7  GFRNONAA >60  GFRAA >60  ANIONGAP 9    No results for input(s): PROT, ALBUMIN, AST, ALT, ALKPHOS, BILITOT in the last 168 hours. Hematology Recent Labs  Lab 02/04/20 1213 02/08/20 1359  WBC 6.4 7.4  RBC 4.97 4.48  HGB 15.7 14.6  HCT 47.1 42.7  MCV 94.8 95.3  MCH 31.6 32.6  MCHC 33.3 34.2  RDW 12.6 12.2  PLT 160 153   BNPNo results for input(s): BNP, PROBNP in the last 168 hours.  DDimer No results for input(s): DDIMER in the last 168 hours.   Radiology/Studies:  DG Chest Portable 1 View  Result Date: 02/08/2020 CLINICAL DATA:  Chest pain EXAM: PORTABLE CHEST 1 VIEW COMPARISON:  02/04/2020 chest radiograph and prior. FINDINGS: The chest is partially imaged. Accentuation of the cardiac silhouette is likely secondary to hypoinflation and AP technique. Normal mediastinal contours. Both lungs are clear. No acute osseous abnormality. Overlying telemetry wires. IMPRESSION: No focal airspace disease. Electronically Signed   By: Stana Bunting M.D.   On: 02/08/2020 14:45   {  Assessment and Plan:   1. Chest pain/Abnormal EKG: His pain has typical and atypical features. His EKG is different than the EKG from 02/04/20. There are now ST abnormalities although this is not clearly injury current. This could represent pericarditis. Will plan urgent cardiac cath to exclude CAD. Will order echo post cath. If no  findings on cath to explain his presentation,will need to consider CTA to exclude aortic dissection. Further plans to follow after cath.   Severity of Illness: The appropriate patient status for this patient is INPATIENT. Inpatient status is judged to be reasonable and necessary in order to provide the required intensity of service to ensure the patient's safety. The patient's presenting symptoms, physical exam findings, and initial radiographic and laboratory data in the context of their chronic comorbidities is felt to place them at high risk for further clinical deterioration. Furthermore, it is not anticipated that the patient will be medically stable for discharge from the hospital within 2 midnights of admission. The following factors support the patient status of inpatient.   " The patient's presenting symptoms include chest pain. " The worrisome physical exam findings include  " The initial radiographic and laboratory data are worrisome because of ST elevation on EKG " The chronic co-morbidities include obesity, tobacco abuse   * I certify that at the point of admission it is my clinical judgment that the patient will require inpatient hospital care spanning beyond 2 midnights from the point of admission due to high intensity of service, high risk for further deterioration and high frequency of surveillance required.*    For questions or updates, please contact [facility] Please consult [email or web address] for contact info under     Signed, Verne Carrow, MD  02/08/2020 3:01 PM

## 2020-02-08 NOTE — H&P (Signed)
[address]       [place] [address]             [phone]        [name] Medical Record #[id number] Date of Birth: Jan 07, 1986  Referring: Dr. Clifton James  primary Care: Patient, No Pcp Per Primary Cardiologist:No primary care provider on file.  Chief Complaint:    Chief Complaint  Patient presents with  . Chest Pain    History of Present Illness:     I was called to see this patient in the cardiac Cath Lab by Dr. Clifton James.  The patient was being evaluated for possible STEMI with emergency cardiac catheterization.  The images of the aortic root were consistent with a type A aortic dissection.  The patient has had several days of chest pain associated with some back pain abdominal pain.  He was seen at a urgent care facility 4 days ago for these complaints.  He was felt to have GERD.  The symptoms have not improved and he presented to the ED again today with some EKG changes and positive troponin.  He was then sent to the cardiac Cath Lab.  The patient has no family history of aortic dissection, hypertension or collagen vascular disease.  No history of family members with aortic aneurysm disease.  The patient smokes tobacco.  No history of cocaine use.   Current Activity/ Functional Status: Lives independently, employed, single   Zubrod Score: At the time of surgery this patient's most appropriate activity status/level should be described as: []     0    Normal activity, no symptoms []     1    Restricted in physical strenuous activity but ambulatory, able to do out light work [x]     2    Ambulatory and capable of self care, unable to do work activities, up and about                 more than 50%  Of the time                            []     3    Only limited self care, in bed greater than 50% of waking hours []     4    Completely disabled, no self care, confined to bed or chair []     5    Moribund  Past Medical History:  Diagnosis  Date  . Tobacco abuse     Past Surgical History:  Procedure Laterality Date  . FRACTURE SURGERY    . HAND SURGERY    . HERNIA REPAIR      Social History   Tobacco Use  Smoking Status Current Every Day Smoker  Smokeless Tobacco Never Used    Social History   Substance and Sexual Activity  Alcohol Use No     No Known Allergies  Current Facility-Administered Medications  Medication Dose Route Frequency Provider Last Rate Last Admin  . aspirin 81 MG chewable tablet           . cefUROXime (ZINACEF) 1.5 g in sodium chloride 0.9 % 100 mL IVPB  1.5 g Intravenous To OR , , MD      . cefUROXime (ZINACEF) 750 mg in sodium chloride 0.9 % 100 mL IVPB  750 mg Intravenous To OR , , MD      . ON 02/09/2020] chlorhexidine (  PERIDEX) 0.12 % solution 15 mL  15 mL Mouth/Throat Once Donata ClayVan Trigt, Theron AristaPeter, MD      . Chlorhexidine Gluconate Cloth 2 % PADS 6 each  6 each Topical Once Donata ClayVan Trigt, Theron AristaPeter, MD      . dexmedetomidine (PRECEDEX) 400 MCG/100ML (4 mcg/mL) infusion  0.1-0.7 mcg/kg/hr Intravenous To OR Donata ClayVan Trigt, Theron AristaPeter, MD      . EPINEPHrine (ADRENALIN) 4 mg in NS 250 mL (0.016 mg/mL) premix infusion  0-10 mcg/min Intravenous To OR Kerin PernaVan Trigt, Scarlet Abad, MD      . hemostatic agents    PRN Donata ClayVan Trigt, Theron AristaPeter, MD   1 application at 02/08/20 1614  . heparin 30,000 units/NS 1000 mL solution for CELLSAVER   Other To OR Donata ClayVan Trigt, Theron AristaPeter, MD      . heparin sodium (porcine) 2,500 Units, papaverine 30 mg in electrolyte-148 (PLASMALYTE-148) 500 mL irrigation   Irrigation To OR Donata ClayVan Trigt, Theron AristaPeter, MD      . insulin regular, human (MYXREDLIN) 100 units/ 100 mL infusion   Intravenous To OR Donata ClayVan Trigt, Theron AristaPeter, MD      . [name] Blood Cardioplegia vial (lidocaine/magnesium/mannitol 1.61W-9U-0.4V0.26g-4g-6.4g)   Intracoronary Once Donata ClayVan Trigt, Theron AristaPeter, MD      . magnesium sulfate (IV Push/IM) injection 40 mEq  40 mEq Other To OR Donata ClayVan Trigt, Theron AristaPeter, MD      . [name][START ON 02/09/2020] metoprolol tartrate  (LOPRESSOR) tablet 12.5 mg  12.5 mg Oral Once Donata ClayVan Trigt, Theron AristaPeter, MD      . milrinone (PRIMACOR) 20 MG/100 ML (0.2 mg/mL) infusion  0.3 mcg/kg/min Intravenous To OR Donata ClayVan Trigt, Theron AristaPeter, MD      . nitroGLYCERIN 50 mg in dextrose 5 % 250 mL (0.2 mg/mL) infusion  2-200 mcg/min Intravenous To OR Donata ClayVan Trigt, Theron AristaPeter, MD      . norepinephrine (LEVOPHED) 4mg  in 250mL premix infusion  0-40 mcg/min Intravenous To OR Donata ClayVan Trigt, Theron AristaPeter, MD      . phenylephrine (NEOSYNEPHRINE) 20-0.9 MG/250ML-% infusion  30-200 mcg/min Intravenous To OR Donata ClayVan Trigt, Theron AristaPeter, MD      . potassium chloride injection 80 mEq  80 mEq Other To OR Donata ClayVan Trigt, Theron AristaPeter, MD      . tranexamic acid (CYKLOKAPRON) 2,500 mg in sodium chloride 0.9 % 250 mL (10 mg/mL) infusion  1.5 mg/kg/hr Intravenous To OR Donata ClayVan Trigt, Theron AristaPeter, MD      . tranexamic acid (CYKLOKAPRON) bolus via infusion - over 30 minutes 2,007 mg  15 mg/kg Intravenous To OR Donata ClayVan Trigt, Theron AristaPeter, MD      . tranexamic acid (CYKLOKAPRON) pump prime solution 268 mg  2 mg/kg Intracatheter To OR Donata ClayVan Trigt, Theron AristaPeter, MD      . vancomycin (VANCOREADY) IVPB 1500 mg/300 mL  1,500 mg Intravenous To OR Kerin PernaVan Trigt, Mithcell Schumpert, MD        Medications Prior to Admission  Medication Sig Dispense Refill Last Dose  . amoxicillin (AMOXIL) 500 MG capsule Take 1 capsule (500 mg total) by mouth 3 (three) times daily. 30 capsule 0   . benzocaine (ORAJEL) 10 % mucosal gel Use as directed 1 application in the mouth or throat as needed.     . calcium carbonate (TUMS - DOSED IN MG ELEMENTAL CALCIUM) 500 MG chewable tablet Chew 1,500 tablets by mouth once as needed. For indigestion     . cetirizine (ZYRTEC ALLERGY) 10 MG tablet Take 1 tablet (10 mg total) by mouth daily. 30 tablet 0   . fluticasone (FLONASE) 50 MCG/ACT nasal spray Place 1 spray into both  nostrils daily. 5 g 0   . ibuprofen (ADVIL,MOTRIN) 600 MG tablet Take 1 tablet (600 mg total) by mouth every 6 (six) hours as needed for mild pain or moderate pain. 15 tablet 0   .  lidocaine (XYLOCAINE) 2 % solution Use as directed 5 mLs in the mouth or throat every 6 (six) hours as needed for mouth pain. 100 mL 0   . omeprazole (PRILOSEC) 20 MG capsule Take 1 capsule (20 mg total) by mouth 2 (two) times daily before a meal. 30 capsule 0   . traMADol (ULTRAM) 50 MG tablet Take 1 tablet (50 mg total) by mouth every 8 (eight) hours as needed for severe pain. 10 tablet 0     Family History  Problem Relation Age of Onset  . Diabetes Mother      Review of Systems:   ROS Weight has been stable No symptoms of Covid pneumonitis-shortness of breath cough fever loss of taste or smell.    Cardiac Review of Systems: Y or  [    ]= no  Chest Pain [  y  ]  Resting SOB [   ] Exertional SOB  [  ]  Orthopnea [  ]   Pedal Edema [   ]    Palpitations [  ] Syncope  [  ]   Presyncope [   ]  General Review of Systems: [Y] = yes [  ]=no Constitional: recent weight change [  ]; anorexia [  ]; fatigue [  ]; nausea [ y ]; night sweats [  ]; fever [  ]; or chills [  ]                                                               Dental: Last Dentist visit:   Eye : blurred vision [  ]; diplopia [   ]; vision changes [  ];  Amaurosis fugax[  ]; Resp: cough [  ];  wheezing[  ];  hemoptysis[  ]; shortness of breath[  ]; paroxysmal nocturnal dyspnea[  ]; dyspnea on exertion[  ]; or orthopnea[  ];  GI:  gallstones[  ], vomiting[  ];  dysphagia[  ]; melena[  ];  hematochezia [  ]; heartburn[  ];   Hx of  Colonoscopy[  ]; GU: kidney stones [  ]; hematuria[  ];   dysuria [  ];  nocturia[  ];  history of     obstruction [  ]; urinary frequency [  ]             Skin: rash, swelling[  ];, hair loss[  ];  peripheral edema[  ];  or itching[  ]; Musculosketetal: myalgias[  ];  joint swelling[  ];  joint erythema[  ];  joint pain[  ];  back pain[  ];  Heme/Lymph: bruising[  ];  bleeding[  ];  anemia[  ];  Neuro: TIA[  ];  headaches[  ];  stroke[  ];  vertigo[  ];  seizures[  ];   paresthesias[  ];   difficulty walking[  ];  Psych:depression[  ]; anxiety[  ];  Endocrine: diabetes[  ];  thyroid dysfunction[  ];  Physical Exam: BP 138/66   Pulse (!) 56   Resp (!) 27   Ht 6\' 2"  (1.88 m)   Wt 133.8 kg   SpO2 96%   BMI 37.88 kg/m   6 foot 2 inch 290 pound male anxious but no acute distress in   Cath Lab Neuro exam intact moves all extremities responds appropriately Sinus rhythm normal blood pressure No cardiac murmur Extremities warm with peripheral pulses intact Small arterial sheath in right radial artery Abdomen soft nondistended   Diagnostic Studies & Laboratory data:     Recent Radiology Findings:   CARDIAC CATHETERIZATION  Result Date: 02/08/2020 Acute ascending aortic dissection Unable to selectively engage the coronary arteries but coronary artery patency demonstrated with non-selective angiography. Recommendations: CT surgery consulted in the cath lab to review studies. Dr. 02/10/2020 present. Planning for emergent repair of his aortic dissection. Pt will be taken for urgent CTA chest/abdomen then to the OR.   PERIPHERAL VASCULAR CATHETERIZATION  Result Date: 02/08/2020 Acute ascending aortic dissection Unable to selectively engage the coronary arteries but coronary artery patency demonstrated with non-selective angiography. Recommendations: CT surgery consulted in the cath lab to review studies. Dr. 02/10/2020 present. Planning for emergent repair of his aortic dissection. Pt will be taken for urgent CTA chest/abdomen then to the OR.   DG Chest Portable 1 View  Result Date: 02/08/2020 CLINICAL DATA:  Chest pain EXAM: PORTABLE CHEST 1 VIEW COMPARISON:  02/04/2020 chest radiograph and prior. FINDINGS: The chest is partially imaged. Accentuation of the cardiac silhouette is likely secondary to hypoinflation and AP technique. Normal mediastinal contours. Both lungs are clear. No acute osseous abnormality. Overlying telemetry wires. IMPRESSION: No focal airspace  disease. Electronically Signed   By: 02/06/2020 M.D.   On: 02/08/2020 14:45     I have independently reviewed the above radiologic studies and discussed with the patient   Recent Lab Findings: Lab Results  Component Value Date   WBC 7.4 02/08/2020   HGB 14.6 02/08/2020   HCT 42.7 02/08/2020   PLT 153 02/08/2020   GLUCOSE 112 (H) 02/08/2020   NA 139 02/08/2020   K 3.8 02/08/2020   CL 103 02/08/2020   CREATININE 1.45 (H) 02/08/2020   BUN 9 02/08/2020   CO2 25 02/08/2020      Assessment / Plan:      CTA shows evidence of a aortic tear in the proximal ascending aorta.  Consistent with type A thoracic aortic dissection.  I have recommended emergency surgical repair to the patient for best chance of survival.  He understands the basic aspects of procedure including use of general anesthesia, cardiopulmonary bypass, sternal and axillary artery incisions, and postoperative ICU care.  He understands the risk of the surgery is 10-20% for major morbidity mortality.  He agrees to proceed under what I feel is informed consent.  He agrees to accept blood product transfusion if needed.       @ME1 @ 02/08/2020 4:23 PM

## 2020-02-08 NOTE — Anesthesia Preprocedure Evaluation (Addendum)
Anesthesia Evaluation  Patient identified by MRN, date of birth, ID band Patient awake    Reviewed: Allergy & Precautions, NPO status , Patient's Chart, lab work & pertinent test resultsPreop documentation limited or incomplete due to emergent nature of procedure.  History of Anesthesia Complications Negative for: history of anesthetic complications  Airway Mallampati: I  TM Distance: >3 FB Neck ROM: Full    Dental  (+) Dental Advisory Given, Teeth Intact   Pulmonary Current Smoker and Patient abstained from smoking.,    Pulmonary exam normal        Cardiovascular  Rhythm:Regular Rate:Normal   Thoracic dissection    Neuro/Psych negative neurological ROS  negative psych ROS   GI/Hepatic negative GI ROS, (+)     substance abuse  marijuana use,   Endo/Other   Obesity   Renal/GU Renal InsufficiencyRenal disease     Musculoskeletal negative musculoskeletal ROS (+)   Abdominal   Peds  Hematology negative hematology ROS (+)   Anesthesia Other Findings   Reproductive/Obstetrics                            Anesthesia Physical Anesthesia Plan  ASA: IV and emergent  Anesthesia Plan: General   Post-op Pain Management:    Induction: Intravenous  PONV Risk Score and Plan: 1 and Treatment may vary due to age or medical condition  Airway Management Planned: Oral ETT  Additional Equipment: Arterial line, CVP, PA Cath, TEE and Ultrasound Guidance Line Placement  Intra-op Plan:   Post-operative Plan: Post-operative intubation/ventilation  Informed Consent: I have reviewed the patients History and Physical, chart, labs and discussed the procedure including the risks, benefits and alternatives for the proposed anesthesia with the patient or authorized representative who has indicated his/her understanding and acceptance.     Only emergency history available and Dental advisory  given  Plan Discussed with: CRNA and Anesthesiologist  Anesthesia Plan Comments: (Bilateral arterial lines, cerebral oximetry )       Anesthesia Quick Evaluation

## 2020-02-08 NOTE — Anesthesia Procedure Notes (Signed)
Procedure Name: Intubation Date/Time: 02/08/2020 4:29 PM Performed by: Moshe Salisbury, CRNA Pre-anesthesia Checklist: Patient identified, Emergency Drugs available, Suction available and Patient being monitored Patient Re-evaluated:Patient Re-evaluated prior to induction Oxygen Delivery Method: Circle System Utilized Preoxygenation: Pre-oxygenation with 100% oxygen Induction Type: IV induction Ventilation: Mask ventilation without difficulty Laryngoscope Size: Mac and 4 Grade View: Grade I Tube type: Oral Tube size: 8.0 mm Number of attempts: 1 Airway Equipment and Method: Stylet Placement Confirmation: ETT inserted through vocal cords under direct vision,  positive ETCO2 and breath sounds checked- equal and bilateral Secured at: 21 cm Tube secured with: Tape Dental Injury: Teeth and Oropharynx as per pre-operative assessment

## 2020-02-08 NOTE — ED Triage Notes (Signed)
Pt here via EMS for epigastric and lower abdominal pain with nausea and vomit x 1. Pt seen at [facility] for same on 8/15 and prescribed omeprazole. EKG changes from 8/15. 4 mg zofran given PTA.

## 2020-02-08 NOTE — Progress Notes (Signed)
  Echocardiogram Echocardiogram Transesophageal has been performed.  Delcie Roch 02/08/2020, 6:09 PM

## 2020-02-08 NOTE — ED Provider Notes (Signed)
[facility] EMERGENCY DEPARTMENT Provider Note   CSN: [id number] Arrival date & time: 02/08/20  1354     History Chief Complaint  Patient presents with  . Chest Pain    Aaron [name] is a 34 y.o. male.  HPI    34yM with CP. Onset Saturday evening. Initially had substernal pain as he was walking up steps. Has been having intermittent similar pain since and also in upper abdomen. Mild nausea. Symptoms seem to be worse when he is up moving around. He was evaluated in the ED recently for the same complaint. Reassuring EKG and troponin normal x2 at this time. Thought that symptoms could be from GERD but not improved since taking prilosec.  Admits to marijuana usage. Denies cocaine.   No past medical history on file.  There are no problems to display for this patient.   Past Surgical History:  Procedure Laterality Date  . FRACTURE SURGERY    . HAND SURGERY    . HERNIA REPAIR         No family history on file.  Social History   Tobacco Use  . Smoking status: Current Every Day Smoker  . Smokeless tobacco: Never Used  Vaping Use  . Vaping Use: Never used  Substance Use Topics  . Alcohol use: No  . Drug use: No    Home Medications Prior to Admission medications   Medication Sig Start Date End Date Taking? Authorizing Provider  amoxicillin (AMOXIL) 500 MG capsule Take 1 capsule (500 mg total) by mouth 3 (three) times daily. 04/02/17   Joni Reining, PA-C  benzocaine (ORAJEL) 10 % mucosal gel Use as directed 1 application in the mouth or throat as needed.    [provider]  calcium carbonate (TUMS - DOSED IN MG ELEMENTAL CALCIUM) 500 MG chewable tablet Chew 1,500 tablets by mouth once as needed. For indigestion    [provider]  cetirizine (ZYRTEC ALLERGY) 10 MG tablet Take 1 tablet (10 mg total) by mouth daily. 10/04/18   Law, Waylan Boga, PA-C  fluticasone (FLONASE) 50 MCG/ACT nasal spray Place 1 spray into both nostrils  daily. 10/04/18   Law, Waylan Boga, PA-C  ibuprofen (ADVIL,MOTRIN) 600 MG tablet Take 1 tablet (600 mg total) by mouth every 6 (six) hours as needed for mild pain or moderate pain. 09/03/17   Antony Madura, PA-C  lidocaine (XYLOCAINE) 2 % solution Use as directed 5 mLs in the mouth or throat every 6 (six) hours as needed for mouth pain. 04/02/17   Joni Reining, PA-C  omeprazole (PRILOSEC) 20 MG capsule Take 1 capsule (20 mg total) by mouth 2 (two) times daily before a meal. 02/04/20   Fayrene Helper, PA-C  traMADol (ULTRAM) 50 MG tablet Take 1 tablet (50 mg total) by mouth every 8 (eight) hours as needed for severe pain. 09/03/17   Antony Madura, PA-C    Allergies    Patient has no known allergies.  Review of Systems   Review of Systems All systems reviewed and negative, other than as noted in HPI.  Physical Exam Updated Vital Signs There were no vitals taken for this visit.  Physical Exam Vitals and nursing note reviewed.  Constitutional:      General: He is not in acute distress.    Appearance: He is well-developed.  HENT:     Head: Normocephalic and atraumatic.  Eyes:     General:        Right eye: No discharge.  Left eye: No discharge.     Conjunctiva/sclera: Conjunctivae normal.  Cardiovascular:     Rate and Rhythm: Normal rate and regular rhythm.     Heart sounds: Normal heart sounds. No murmur heard.  No friction rub. No gallop.   Pulmonary:     Effort: Pulmonary effort is normal. No respiratory distress.     Breath sounds: Normal breath sounds.  Abdominal:     General: There is no distension.     Palpations: Abdomen is soft.     Tenderness: There is no abdominal tenderness.  Musculoskeletal:        General: No tenderness.     Cervical back: Neck supple.  Skin:    General: Skin is warm and dry.  Neurological:     Mental Status: He is alert.  Psychiatric:        Behavior: Behavior normal.        Thought Content: Thought content normal.     ED Results /  Procedures / Treatments   Labs (all labs ordered are listed, but only abnormal results are displayed) Labs Reviewed  BASIC METABOLIC PANEL - Abnormal; Notable for the following components:      Result Value   Glucose, Bld 112 (*)    Creatinine, Ser 1.45 (*)    All other components within normal limits  HEMOGLOBIN AND HEMATOCRIT, BLOOD - Abnormal; Notable for the following components:   Hemoglobin 12.2 (*)    HCT 35.5 (*)    All other components within normal limits  PLATELET COUNT - Abnormal; Notable for the following components:   Platelets 72 (*)    All other components within normal limits  FIBRINOGEN - Abnormal; Notable for the following components:   Fibrinogen 181 (*)    All other components within normal limits  GLUCOSE, CAPILLARY - Abnormal; Notable for the following components:   Glucose-Capillary 154 (*)    All other components within normal limits  CBC - Abnormal; Notable for the following components:   WBC 13.7 (*)    RBC 3.94 (*)    Hemoglobin 12.3 (*)    HCT 36.1 (*)    Platelets 102 (*)    All other components within normal limits  GLUCOSE, CAPILLARY - Abnormal; Notable for the following components:   Glucose-Capillary 156 (*)    All other components within normal limits  GLUCOSE, CAPILLARY - Abnormal; Notable for the following components:   Glucose-Capillary 150 (*)    All other components within normal limits  GLUCOSE, CAPILLARY - Abnormal; Notable for the following components:   Glucose-Capillary 140 (*)    All other components within normal limits  GLUCOSE, CAPILLARY - Abnormal; Notable for the following components:   Glucose-Capillary 145 (*)    All other components within normal limits  GLUCOSE, CAPILLARY - Abnormal; Notable for the following components:   Glucose-Capillary 138 (*)    All other components within normal limits  POCT I-STAT, CHEM 8 - Abnormal; Notable for the following components:   Creatinine, Ser 1.30 (*)    Glucose, Bld 119 (*)     All other components within normal limits  POCT I-STAT 7, (LYTES, BLD GAS, ICA,H+H) - Abnormal; Notable for the following components:   pO2, Arterial 191 (*)    Bicarbonate 28.3 (*)    Calcium, Ion 1.14 (*)    HCT 35.0 (*)    Hemoglobin 11.9 (*)    All other components within normal limits  SARS CORONAVIRUS 2 BY RT PCR (HOSPITAL ORDER, PERFORMED IN  [facility])  MRSA PCR SCREENING  CBC  LIPASE, BLOOD  GLOBAL TEG PANEL  GLOBAL TEG PANEL  PROTIME-INR  APTT  BLOOD GAS, ARTERIAL  BLOOD GAS, ARTERIAL  CBC  BASIC METABOLIC PANEL  MAGNESIUM  BASIC METABOLIC PANEL  MAGNESIUM  CBC  CBC  BASIC METABOLIC PANEL  MAGNESIUM  COOXEMETRY PANEL  TYPE AND SCREEN  ABO/RH  PREPARE RBC (CROSSMATCH)  PREPARE RBC (CROSSMATCH)  PREPARE PLATELET PHERESIS  PREPARE FRESH FROZEN PLASMA  PREPARE CRYOPRECIPITATE  SURGICAL PATHOLOGY  TROPONIN I (HIGH SENSITIVITY)    EKG EKG Interpretation  Date/Time:  Thursday February 08 2020 13:55:40 EDT Ventricular Rate:  75 PR Interval:  148 QRS Duration: 100 QT Interval:  382 QTC Calculation: 426 R Axis:   60 Text Interpretation: Normal sinus rhythm STE inferiorly and anterolaterally, new from prior on 02/04/20 Confirmed by Raeford Razor [phone]) on 02/09/2020 7:28:12 AM   Radiology DG Chest 1 View  Result Date: 02/09/2020 CLINICAL DATA:  Post ascending aortic repair EXAM: CHEST  1 VIEW COMPARISON:  CT angiography 02/08/2020, radiograph 02/08/2020 FINDINGS: Endotracheal tube terminates high within the trachea, approximately 8 cm from the carina. Consider advancing approximately 3 cm to the mid trachea. Transesophageal tube tip terminates in the lower thoracic esophagus with side port in the midthoracic esophagus. Consider advancing approximately 10-15 cm for optimal positioning. Bilateral pleural drains and a mediastinal drain are in place. Swan-Ganz catheter via a right IJ approach terminating at the level of the pulmonary trunk. Postsurgical  changes from sternotomy and aortic repair with likely expected postoperative widening of the upper mediastinum. Additional surgical skin clips and skin staples projecting over the right axillary region. Low lung volumes with some hazy interstitial opacities which could reflect atelectasis or mild edema in the postoperative state. No pneumothorax or visible effusions at this time. IMPRESSION: 1. Endotracheal tube terminates high within the trachea, approximately 8 cm from the carina. Consider advancing approximately 3 cm to the mid trachea. 2. Transesophageal tube tip terminates in the lower thoracic esophagus with side port in the midthoracic esophagus. Consider advancing approximately 10-15 cm for optimal positioning. 3. Expected postoperative changes from aortic repair with some mild postoperative mediastinal widening. Mediastinal and pleural drains in place. 4. Low volumes with atelectasis and possible mild edema. Electronically Signed   By: Kreg Shropshire M.D.   On: 02/09/2020 02:31   CARDIAC CATHETERIZATION  Result Date: 02/08/2020 Acute ascending aortic dissection Unable to selectively engage the coronary arteries but coronary artery patency demonstrated with non-selective angiography. Recommendations: CT surgery consulted in the cath lab to review studies. Dr. Donata Clay present. Planning for emergent repair of his aortic dissection. Pt will be taken for urgent CTA chest/abdomen then to the OR.   PERIPHERAL VASCULAR CATHETERIZATION  Result Date: 02/08/2020 Acute ascending aortic dissection Unable to selectively engage the coronary arteries but coronary artery patency demonstrated with non-selective angiography. Recommendations: CT surgery consulted in the cath lab to review studies. Dr. Donata Clay present. Planning for emergent repair of his aortic dissection. Pt will be taken for urgent CTA chest/abdomen then to the OR.   DG Chest Port 1 View  Result Date: 02/09/2020 CLINICAL DATA:  Aortic dissection  EXAM: PORTABLE CHEST 1 VIEW COMPARISON:  CTA chest abdomen pelvis 02/08/2020 FINDINGS: Status post median sternotomy. There are bilateral chest tubes. Endotracheal tube tip is at the level of the clavicular heads. Esophageal tube courses below the field of view. There is a Swan-Ganz catheter with tip projecting over the main pulmonary  artery. Lungs are clear. IMPRESSION: 1. Endotracheal tube tip at the level of the clavicular heads. 2. Clear lungs. Electronically Signed   By: Deatra Robinson M.D.   On: 02/09/2020 02:57   DG Chest Portable 1 View  Result Date: 02/08/2020 CLINICAL DATA:  Chest pain EXAM: PORTABLE CHEST 1 VIEW COMPARISON:  02/04/2020 chest radiograph and prior. FINDINGS: The chest is partially imaged. Accentuation of the cardiac silhouette is likely secondary to hypoinflation and AP technique. Normal mediastinal contours. Both lungs are clear. No acute osseous abnormality. Overlying telemetry wires. IMPRESSION: No focal airspace disease. Electronically Signed   By: Stana Bunting M.D.   On: 02/08/2020 14:45   CT Angio Chest/Abd/Pel for Dissection W and/or W/WO  Result Date: 02/08/2020 CLINICAL DATA:  Chest pain and back pain. EXAM: CT ANGIOGRAPHY CHEST, ABDOMEN AND PELVIS TECHNIQUE: Non-contrast CT of the chest was initially obtained. Multidetector CT imaging through the chest, abdomen and pelvis was performed using the standard protocol during bolus administration of intravenous contrast. Multiplanar reconstructed images and MIPs were obtained and reviewed to evaluate the vascular anatomy. CONTRAST:  90mL OMNIPAQUE IOHEXOL 350 MG/ML SOLN COMPARISON:  January 01, 2009 FINDINGS: CTA CHEST FINDINGS Cardiovascular: The ascending thoracic aorta is dilated and measures approximately 6.5 cm x 6.6 cm. Dissection of the ascending thoracic aorta is also seen. This extends through the aortic arch and into the descending thoracic aorta. Further progression into the abdomen is noted to the level just  before the bifurcation into the bilateral common iliac arteries. Involvement of a short portion of the proximal right brachiocephalic artery is seen (axial CT images 24 through 26, CT series number 7). Satisfactory opacification of the pulmonary arteries to the segmental level. No evidence of pulmonary embolism. There is mild cardiomegaly. A trace amount of pericardial fluid is seen. Mediastinum/Nodes: No enlarged mediastinal, hilar, or axillary lymph nodes. Thyroid gland, trachea, and esophagus demonstrate no significant findings. Lungs/Pleura: Lungs are clear. No pleural effusion or pneumothorax. Musculoskeletal: No chest wall abnormality. No acute or significant osseous findings. Review of the MIP images confirms the above findings. CTA ABDOMEN AND PELVIS FINDINGS VASCULAR Aorta: Normal caliber aorta with extensive dissection to the level proximal to the bilateral common iliac arteries. There is no evidence of abdominal aortic aneurysm, vasculitis or significant stenosis. Celiac: Patent without evidence of aneurysm, dissection, vasculitis or significant stenosis. SMA: Patent without evidence of aneurysm, dissection, vasculitis or significant stenosis. Renals: Both renal arteries are patent without evidence of aneurysm, dissection, vasculitis, fibromuscular dysplasia or significant stenosis. IMA: Patent without evidence of aneurysm, dissection, vasculitis or significant stenosis. Inflow: Patent without evidence of aneurysm, dissection, vasculitis or significant stenosis. Veins: No obvious venous abnormality within the limitations of this arterial phase study. Review of the MIP images confirms the above findings. NON-VASCULAR Hepatobiliary: No focal liver abnormality is seen. No gallstones, gallbladder wall thickening, or biliary dilatation. Pancreas: Unremarkable. No pancreatic ductal dilatation or surrounding inflammatory changes. Spleen: Normal in size without focal abnormality. Adrenals/Urinary Tract: Adrenal  glands are unremarkable. Kidneys are normal, without renal calculi, focal lesion, or hydronephrosis. Bladder is unremarkable. Stomach/Bowel: Stomach is within normal limits. Appendix appears normal. No evidence of bowel wall thickening, distention, or inflammatory changes. Lymphatic: No abnormal abdominal or pelvic lymph nodes are seen. Reproductive: Prostate is unremarkable. Other: No abdominal wall hernia or abnormality. No abdominopelvic ascites. Musculoskeletal: No acute or significant osseous findings. Review of the MIP images confirms the above findings. IMPRESSION: 1. 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 2. Aneurysmal dilatation of the ascending thoracic aorta, measuring approximately 6.5 cm x 6.6 cm. 3. Mild cardiomegaly with trace amount of pericardial fluid. Electronically Signed   By: Aram Candela M.D.   On: 02/08/2020 19:51    Procedures Procedures (including critical care time)  CRITICAL CARE Performed by: Raeford Razor Total critical care time: 35 minutes Critical care time was exclusive of separately billable procedures and treating other patients. Critical care was necessary to treat or prevent imminent or life-threatening deterioration. Critical care was time spent personally by me on the following activities: development of treatment plan with patient and/or surrogate as well as nursing, discussions with consultants, evaluation of patient's response to treatment, examination of patient, obtaining history from patient or surrogate, ordering and performing treatments and interventions, ordering and review of laboratory studies, ordering and review of radiographic studies, pulse oximetry and re-evaluation of patient's condition.  Medications Ordered in ED Medications  aspirin 81 MG chewable tablet (has no administration in time range)  0.45 % sodium chloride infusion (has no  administration in time range)  lactated ringers infusion ( Intravenous Not Given 02/09/20 0341)  lactated ringers infusion ( Intravenous Rate/Dose Verify 02/09/20 0700)  sodium chloride flush (NS) 0.9 % injection 3 mL (has no administration in time range)  sodium chloride flush (NS) 0.9 % injection 3 mL (has no administration in time range)  0.9 %  sodium chloride infusion (250 mLs Intravenous Not Given 02/09/20 0522)  0.9 %  sodium chloride infusion ( Intravenous New Bag/Given (Non-Interop) 02/09/20 0344)  nitroGLYCERIN 50 mg in dextrose 5 % 250 mL (0.2 mg/mL) infusion (0 mcg/min Intravenous Stopped 02/09/20 0337)  phenylephrine (NEOSYNEPHRINE) 20-0.9 MG/250ML-% infusion ( Intravenous Canceled Entry 02/09/20 0251)  lactated ringers infusion 500 mL (has no administration in time range)  albumin human 5 % solution 12.5 g (12.5 g Intravenous New Bag/Given (Non-Interop) 02/09/20 0323)  potassium chloride 10 mEq in 50 mL *CENTRAL LINE* IVPB ( Intravenous Canceled Entry 02/09/20 0452)  magnesium sulfate IVPB 4 g 100 mL ( Intravenous Rate/Dose Verify 02/09/20 0700)  cefUROXime (ZINACEF) 1.5 g in sodium chloride 0.9 % 100 mL IVPB ( Intravenous Stopped 02/09/20 0556)  vancomycin (VANCOCIN) IVPB 1000 mg/200 mL premix (has no administration in time range)  acetaminophen (TYLENOL) tablet 1,000 mg (has no administration in time range)    Or  acetaminophen (TYLENOL) 160 MG/5ML solution 1,000 mg (has no administration in time range)  traMADol (ULTRAM) tablet 50-100 mg (has no administration in time range)  oxyCODONE (Oxy IR/ROXICODONE) immediate release tablet 5-10 mg (has no administration in time range)  morphine 2 MG/ML injection 1-4 mg (has no administration in time range)  midazolam (VERSED) injection 2 mg (2 mg Intravenous Given 02/09/20 0707)  docusate sodium (COLACE) capsule 200 mg (has no administration in time range)  bisacodyl (DULCOLAX) EC tablet 10 mg (has no administration in time range)    Or    bisacodyl (DULCOLAX) suppository 10 mg (has no administration in time range)  ondansetron (ZOFRAN) injection 4 mg (has no administration in time range)  aspirin EC tablet 325 mg (has no administration in time range)    Or  aspirin chewable tablet 324 mg (has no administration in time range)  metoprolol tartrate (LOPRESSOR) injection 2.5-5 mg (has no administration in time range)  metoprolol tartrate (LOPRESSOR) tablet 12.5 mg (has no administration in time range)    Or  metoprolol tartrate (LOPRESSOR) 25 mg/10 mL oral suspension  12.5 mg (has no administration in time range)  famotidine (PEPCID) IVPB 20 mg premix ( Intravenous Stopped 02/09/20 0323)  pantoprazole (PROTONIX) EC tablet 40 mg (has no administration in time range)  dexmedetomidine (PRECEDEX) 400 MCG/100ML (4 mcg/mL) infusion (0.7 mcg/kg/hr  133.8 kg Intravenous New Bag/Given 02/09/20 0716)  insulin regular, human (MYXREDLIN) 100 units/ 100 mL infusion ( Intravenous Rate/Dose Verify 02/09/20 0700)  dextrose 50 % solution 0-50 mL (has no administration in time range)  norepinephrine (LEVOPHED) 4mg  in premix infusion ( Intravenous Canceled Entry 02/09/20 0250)  milrinone (PRIMACOR) 20 MG/100 ML (0.2 mg/mL) infusion (0.25 mcg/kg/min  133.8 kg Intravenous Rate/Dose Verify 02/09/20 0700)  nicardipine (CARDENE) 20mg  in 0.86% saline 02/11/20 IV infusion (0.1 mg/ml) (0 mg/hr Intravenous Paused 02/09/20 0609)  Chlorhexidine Gluconate Cloth 2 % PADS 6 each (has no administration in time range)  heparin injection 4,000 Units (4,000 Units Intravenous Given 02/08/20 1447)  dexmedetomidine (PRECEDEX) 400 MCG/100ML (4 mcg/mL) infusion (0.7 mcg/kg/hr  133.8 kg Intravenous Rate/Dose Change 02/08/20 2355)  insulin regular, human (MYXREDLIN) 100 units/ 100 mL infusion (2.2 Units/hr Intravenous Rate/Dose Change 02/09/20 0059)  milrinone (PRIMACOR) 20 MG/100 ML (0.2 mg/mL) infusion (0.25 mcg/kg/min  133.8 kg Intravenous New Bag/Given 02/08/20 2356)   norepinephrine (LEVOPHED) 4mg  in 02/11/20 premix infusion (0 mcg/min Intravenous Stopped 02/09/20 0024)  phenylephrine (NEOSYNEPHRINE) 20-0.9 MG/250ML-% infusion (0 mcg/min Intravenous Stopped 02/09/20 0018)  tranexamic acid (CYKLOKAPRON) bolus via infusion - over 30 minutes 2,007 mg (2,007 mg Intravenous Given 02/08/20 1750)  tranexamic acid (CYKLOKAPRON) 2,500 mg in sodium chloride 0.9 % 250 mL (10 mg/mL) infusion (1.5 mg/kg/hr  133.8 kg Intravenous Restarted 02/08/20 2243)  vancomycin (VANCOREADY) IVPB 1500 mg/300 mL (1,500 mg Intravenous Given 02/08/20 1715)  cefUROXime (ZINACEF) 1.5 g in sodium chloride 0.9 % 100 mL IVPB (1.5 g Intravenous New Bag/Given 02/08/20 1715)  cefUROXime (ZINACEF) 750 mg in sodium chloride 0.9 % 100 mL IVPB (750 mg Intravenous New Bag/Given 02/09/20 0011)  acetaminophen (TYLENOL) 160 MG/5ML solution 650 mg ( Per Tube See Alternative 02/09/20 0252)    Or  acetaminophen (TYLENOL) suppository 650 mg (650 mg Rectal Given 02/09/20 0252)  chlorhexidine (PERIDEX) 0.12 % solution 15 mL (15 mLs Mouth/Throat Given 02/09/20 0342)  desmopressin (DDAVP) 20 mcg in sodium chloride 0.9 % 50 mL IVPB (20 mcg Intravenous New Bag/Given 02/09/20 0019)  coagulation factor VIIa recomb (NOVOSEVEN) injection 6,000 mcg (6,000 mcg Intravenous Given 02/09/20 0122)    ED Course  I have reviewed the triage vital signs and the nursing notes.  Pertinent labs & imaging results that were available during my care of the patient were reviewed by me and considered in my medical decision making (see chart for details).    MDM Rules/Calculators/A&P                          34yM with intermittent CP. Seen a few days ago for the same. His EKG is clearly changed from 02/04/20. Some features typical and some not. Currently no CP but vague upper abdominal pain. Also some pain in his lower back but he attributes this to ride in the ambulance. I did not activate Code STEMI but discussed with Dr 02/11/20, cardiology. He  promptly came to the ED to evaluate the patient. On his exam he noted the patient moving around as if he couldn't get comfortable and astutely questioned possible dissection. Clinical picture concerning but not clear as to exact etiology. He took the patient for LHC.  Subsequently noted to have type A dissection and had emergent repair.    Final Clinical Impression(s) / ED Diagnoses Final diagnoses:  Chest pain, unspecified type  Nonspecific abnormal electrocardiogram (ECG) (EKG)    Rx / DC Orders ED Discharge Orders    None       Raeford RazorKohut, Nitisha Civello, MD 02/09/20 [phone]

## 2020-02-08 NOTE — Anesthesia Procedure Notes (Signed)
Central Venous Catheter Insertion Performed by: Audry Pili, MD, anesthesiologist Start/End8/19/2021 4:46 PM, 02/08/2020 4:54 PM Patient location: Pre-op. Preanesthetic checklist: patient identified, IV checked, risks and benefits discussed, surgical consent, monitors and equipment checked, pre-op evaluation, timeout performed and anesthesia consent Position: Trendelenburg Lidocaine 1% used for infiltration and patient sedated Hand hygiene performed , maximum sterile barriers used  and Seldinger technique used Catheter size: 8.5 Fr Central line was placed.MAC introducer Procedure performed using ultrasound guided technique. Ultrasound Notes:anatomy identified, needle tip was noted to be adjacent to the nerve/plexus identified, no ultrasound evidence of intravascular and/or intraneural injection and image(s) printed for medical record Attempts: 1 Following insertion, line sutured, dressing applied and Biopatch. Post procedure assessment: blood return through all ports, no air and free fluid flow  Patient tolerated the procedure well with no immediate complications.

## 2020-02-08 NOTE — Anesthesia Procedure Notes (Signed)
Central Venous Catheter Insertion Performed by: Beryle Lathe, MD, anesthesiologist Start/End8/19/2021 4:52 PM, 02/08/2020 4:54 PM Patient location: Pre-op. Preanesthetic checklist: patient identified, IV checked, risks and benefits discussed, surgical consent, monitors and equipment checked, pre-op evaluation, timeout performed and anesthesia consent Position: Trendelenburg Hand hygiene performed  and maximum sterile barriers used  Total catheter length 10. PA cath was placed.Swan type:thermodilution PA Cath depth:55 Procedure performed without using ultrasound guided technique. Attempts: 1 Patient tolerated the procedure well with no immediate complications.

## 2020-02-09 ENCOUNTER — Emergency Department (HOSPITAL_COMMUNITY): Payer: Self-pay

## 2020-02-09 ENCOUNTER — Inpatient Hospital Stay (HOSPITAL_COMMUNITY): Payer: Self-pay

## 2020-02-09 ENCOUNTER — Encounter (HOSPITAL_COMMUNITY): Payer: Self-pay | Admitting: Cardiovascular Disease

## 2020-02-09 DIAGNOSIS — I71019 Dissection of thoracic aorta, unspecified: Secondary | ICD-10-CM | POA: Diagnosis present

## 2020-02-09 DIAGNOSIS — I7101 Dissection of thoracic aorta: Secondary | ICD-10-CM | POA: Diagnosis present

## 2020-02-09 LAB — CBC
HCT: 36.1 % — ABNORMAL LOW (ref 39.0–52.0)
HCT: 38.1 % — ABNORMAL LOW (ref 39.0–52.0)
HCT: 36.8 % — ABNORMAL LOW (ref 39.0–52.0)
Hemoglobin: 12.3 g/dL — ABNORMAL LOW (ref 13.0–17.0)
Hemoglobin: 13.2 g/dL (ref 13.0–17.0)
Hemoglobin: 12.7 g/dL — ABNORMAL LOW (ref 13.0–17.0)
MCH: 31.2 pg (ref 26.0–34.0)
MCH: 31.7 pg (ref 26.0–34.0)
MCH: 31.4 pg (ref 26.0–34.0)
MCHC: 34.1 g/dL (ref 30.0–36.0)
MCHC: 34.6 g/dL (ref 30.0–36.0)
MCHC: 34.5 g/dL (ref 30.0–36.0)
MCV: 91.6 fL (ref 80.0–100.0)
MCV: 91.4 fL (ref 80.0–100.0)
MCV: 90.9 fL (ref 80.0–100.0)
Platelets: 102 10*3/uL — ABNORMAL LOW (ref 150–400)
Platelets: 118 10*3/uL — ABNORMAL LOW (ref 150–400)
Platelets: 119 10*3/uL — ABNORMAL LOW (ref 150–400)
RBC: 3.94 MIL/uL — ABNORMAL LOW (ref 4.22–5.81)
RBC: 4.17 MIL/uL — ABNORMAL LOW (ref 4.22–5.81)
RBC: 4.05 MIL/uL — ABNORMAL LOW (ref 4.22–5.81)
RDW: 13.2 % (ref 11.5–15.5)
RDW: 13.5 % (ref 11.5–15.5)
RDW: 13.6 % (ref 11.5–15.5)
WBC: 13.7 10*3/uL — ABNORMAL HIGH (ref 4.0–10.5)
WBC: 11 10*3/uL — ABNORMAL HIGH (ref 4.0–10.5)
WBC: 12.6 10*3/uL — ABNORMAL HIGH (ref 4.0–10.5)
nRBC: 0 % (ref 0.0–0.2)
nRBC: 0 % (ref 0.0–0.2)
nRBC: 0 % (ref 0.0–0.2)

## 2020-02-09 LAB — PREPARE PLATELET PHERESIS
Unit division: 0
Unit division: 0

## 2020-02-09 LAB — BASIC METABOLIC PANEL
Anion gap: 7 (ref 5–15)
Anion gap: 9 (ref 5–15)
BUN: 11 mg/dL (ref 6–20)
BUN: 13 mg/dL (ref 6–20)
CO2: 25 mmol/L (ref 22–32)
CO2: 24 mmol/L (ref 22–32)
Calcium: 8.2 mg/dL — ABNORMAL LOW (ref 8.9–10.3)
Calcium: 8.5 mg/dL — ABNORMAL LOW (ref 8.9–10.3)
Chloride: 108 mmol/L (ref 98–111)
Chloride: 107 mmol/L (ref 98–111)
Creatinine, Ser: 1.47 mg/dL — ABNORMAL HIGH (ref 0.61–1.24)
Creatinine, Ser: 1.51 mg/dL — ABNORMAL HIGH (ref 0.61–1.24)
GFR calc Af Amer: >60 mL/min (ref >60)
GFR calc Af Amer: >60 mL/min (ref >60)
GFR calc non Af Amer: >60 mL/min (ref >60)
GFR calc non Af Amer: >60 mL/min (ref >60)
Glucose, Bld: 137 mg/dL — ABNORMAL HIGH (ref 70–99)
Glucose, Bld: 111 mg/dL — ABNORMAL HIGH (ref 70–99)
Potassium: 4.6 mmol/L (ref 3.5–5.1)
Potassium: 4 mmol/L (ref 3.5–5.1)
Sodium: 140 mmol/L (ref 135–145)
Sodium: 140 mmol/L (ref 135–145)

## 2020-02-09 LAB — POCT I-STAT 7, (LYTES, BLD GAS, ICA,H+H)
Acid-Base Excess: 2 mmol/L (ref 0.0–2.0)
Acid-Base Excess: 1 mmol/L (ref 0.0–2.0)
Acid-Base Excess: 0 mmol/L (ref 0.0–2.0)
Acid-Base Excess: 1 mmol/L (ref 0.0–2.0)
Acid-Base Excess: 0 mmol/L (ref 0.0–2.0)
Bicarbonate: 28.3 mmol/L — ABNORMAL HIGH (ref 20.0–28.0)
Bicarbonate: 27.3 mmol/L (ref 20.0–28.0)
Bicarbonate: 26.2 mmol/L (ref 20.0–28.0)
Bicarbonate: 26.2 mmol/L (ref 20.0–28.0)
Bicarbonate: 25.1 mmol/L (ref 20.0–28.0)
Calcium, Ion: 1.14 mmol/L — ABNORMAL LOW (ref 1.15–1.40)
Calcium, Ion: 1.22 mmol/L (ref 1.15–1.40)
Calcium, Ion: 1.25 mmol/L (ref 1.15–1.40)
Calcium, Ion: 1.26 mmol/L (ref 1.15–1.40)
Calcium, Ion: 1.24 mmol/L (ref 1.15–1.40)
HCT: 35 % — ABNORMAL LOW (ref 39.0–52.0)
HCT: 36 % — ABNORMAL LOW (ref 39.0–52.0)
HCT: 37 % — ABNORMAL LOW (ref 39.0–52.0)
HCT: 37 % — ABNORMAL LOW (ref 39.0–52.0)
HCT: 35 % — ABNORMAL LOW (ref 39.0–52.0)
Hemoglobin: 11.9 g/dL — ABNORMAL LOW (ref 13.0–17.0)
Hemoglobin: 12.2 g/dL — ABNORMAL LOW (ref 13.0–17.0)
Hemoglobin: 12.6 g/dL — ABNORMAL LOW (ref 13.0–17.0)
Hemoglobin: 12.6 g/dL — ABNORMAL LOW (ref 13.0–17.0)
Hemoglobin: 11.9 g/dL — ABNORMAL LOW (ref 13.0–17.0)
O2 Saturation: 100 %
O2 Saturation: 100 %
O2 Saturation: 100 %
O2 Saturation: 99 %
O2 Saturation: 96 %
Patient temperature: 34.2
Patient temperature: 36.9
Patient temperature: 36.8
Patient temperature: 37
Patient temperature: 98
Potassium: 4.7 mmol/L (ref 3.5–5.1)
Potassium: 4.6 mmol/L (ref 3.5–5.1)
Potassium: 4.5 mmol/L (ref 3.5–5.1)
Potassium: 4.4 mmol/L (ref 3.5–5.1)
Potassium: 4 mmol/L (ref 3.5–5.1)
Sodium: 142 mmol/L (ref 135–145)
Sodium: 142 mmol/L (ref 135–145)
Sodium: 142 mmol/L (ref 135–145)
Sodium: 142 mmol/L (ref 135–145)
Sodium: 141 mmol/L (ref 135–145)
TCO2: 30 mmol/L (ref 22–32)
TCO2: 29 mmol/L (ref 22–32)
TCO2: 28 mmol/L (ref 22–32)
TCO2: 28 mmol/L (ref 22–32)
TCO2: 26 mmol/L (ref 22–32)
pCO2 arterial: 47.2 mmHg (ref 32.0–48.0)
pCO2 arterial: 47.7 mmHg (ref 32.0–48.0)
pCO2 arterial: 46.2 mmHg (ref 32.0–48.0)
pCO2 arterial: 45.5 mmHg (ref 32.0–48.0)
pCO2 arterial: 40.7 mmHg (ref 32.0–48.0)
pH, Arterial: 7.373 (ref 7.350–7.450)
pH, Arterial: 7.364 (ref 7.350–7.450)
pH, Arterial: 7.361 (ref 7.350–7.450)
pH, Arterial: 7.369 (ref 7.350–7.450)
pH, Arterial: 7.396 (ref 7.350–7.450)
pO2, Arterial: 191 mmHg — ABNORMAL HIGH (ref 83.0–108.0)
pO2, Arterial: 235 mmHg — ABNORMAL HIGH (ref 83.0–108.0)
pO2, Arterial: 182 mmHg — ABNORMAL HIGH (ref 83.0–108.0)
pO2, Arterial: 138 mmHg — ABNORMAL HIGH (ref 83.0–108.0)
pO2, Arterial: 80 mmHg — ABNORMAL LOW (ref 83.0–108.0)

## 2020-02-09 LAB — BPAM PLATELET PHERESIS
Blood Product Expiration Date: 202108212359
Blood Product Expiration Date: 202108212359
ISSUE DATE / TIME: 202108192253
ISSUE DATE / TIME: 202108192253
Unit Type and Rh: 5100
Unit Type and Rh: 8400

## 2020-02-09 LAB — PREPARE CRYOPRECIPITATE
Unit division: 0
Unit division: 0

## 2020-02-09 LAB — BPAM FFP
Blood Product Expiration Date: 202108232359
Blood Product Expiration Date: 202108242359
ISSUE DATE / TIME: 202108192255
ISSUE DATE / TIME: 202108192255
Unit Type and Rh: 1700
Unit Type and Rh: 7300

## 2020-02-09 LAB — PREPARE FRESH FROZEN PLASMA
Unit division: 0
Unit division: 0

## 2020-02-09 LAB — GLUCOSE, CAPILLARY
Glucose-Capillary: 154 mg/dL — ABNORMAL HIGH (ref 70–99)
Glucose-Capillary: 156 mg/dL — ABNORMAL HIGH (ref 70–99)
Glucose-Capillary: 150 mg/dL — ABNORMAL HIGH (ref 70–99)
Glucose-Capillary: 140 mg/dL — ABNORMAL HIGH (ref 70–99)
Glucose-Capillary: 145 mg/dL — ABNORMAL HIGH (ref 70–99)
Glucose-Capillary: 138 mg/dL — ABNORMAL HIGH (ref 70–99)
Glucose-Capillary: 121 mg/dL — ABNORMAL HIGH (ref 70–99)
Glucose-Capillary: 139 mg/dL — ABNORMAL HIGH (ref 70–99)
Glucose-Capillary: 133 mg/dL — ABNORMAL HIGH (ref 70–99)
Glucose-Capillary: 110 mg/dL — ABNORMAL HIGH (ref 70–99)
Glucose-Capillary: 107 mg/dL — ABNORMAL HIGH (ref 70–99)
Glucose-Capillary: 112 mg/dL — ABNORMAL HIGH (ref 70–99)
Glucose-Capillary: 91 mg/dL (ref 70–99)

## 2020-02-09 LAB — BPAM CRYOPRECIPITATE
Blood Product Expiration Date: 202108200500
Blood Product Expiration Date: 202108200500
ISSUE DATE / TIME: 202108192325
ISSUE DATE / TIME: 202108192325
Unit Type and Rh: 5100
Unit Type and Rh: 5100

## 2020-02-09 LAB — MRSA PCR SCREENING: MRSA by PCR: NEGATIVE

## 2020-02-09 LAB — COOXEMETRY PANEL
Carboxyhemoglobin: 1.6 % — ABNORMAL HIGH (ref 0.5–1.5)
Methemoglobin: 0.9 % (ref 0.0–1.5)
O2 Saturation: 71.3 %
Total hemoglobin: 13.1 g/dL (ref 12.0–16.0)

## 2020-02-09 LAB — MAGNESIUM
Magnesium: 2.8 mg/dL — ABNORMAL HIGH (ref 1.7–2.4)
Magnesium: 2.4 mg/dL (ref 1.7–2.4)

## 2020-02-09 LAB — PROTIME-INR
INR: 0.9 (ref 0.8–1.2)
Prothrombin Time: 11.5 seconds (ref 11.4–15.2)

## 2020-02-09 LAB — HEMOGLOBIN AND HEMATOCRIT, BLOOD
HCT: 35.5 % — ABNORMAL LOW (ref 39.0–52.0)
Hemoglobin: 12.2 g/dL — ABNORMAL LOW (ref 13.0–17.0)

## 2020-02-09 LAB — ECHO INTRAOPERATIVE TEE
Height: 74 in
Weight: 4720 oz

## 2020-02-09 LAB — FIBRINOGEN: Fibrinogen: 181 mg/dL — ABNORMAL LOW (ref 210–475)

## 2020-02-09 LAB — PLATELET COUNT: Platelets: 72 10*3/uL — ABNORMAL LOW (ref 150–400)

## 2020-02-09 LAB — APTT: aPTT: 34 seconds (ref 24–36)

## 2020-02-09 MED ORDER — METOPROLOL TARTRATE 5 MG/5ML IV SOLN: 2.5000 mg | INTRAVENOUS | Status: DC | PRN

## 2020-02-09 MED ORDER — ACETAMINOPHEN 500 MG PO TABS
1000.0000 mg | ORAL_TABLET | Freq: Four times a day (QID) | ORAL | Status: DC
  Administered 2020-02-09 – 2020-02-11 (×9): 1000 mg via ORAL
  Filled 2020-02-09 (×9): qty 2

## 2020-02-09 MED ORDER — CHLORHEXIDINE GLUCONATE 0.12 % MT SOLN
15.0000 mL | OROMUCOSAL | Status: AC
  Administered 2020-02-09: 15 mL via OROMUCOSAL

## 2020-02-09 MED ORDER — ORAL CARE MOUTH RINSE
15.0000 mL | Freq: Two times a day (BID) | OROMUCOSAL | Status: DC
  Administered 2020-02-09 – 2020-02-13 (×7): 15 mL via OROMUCOSAL

## 2020-02-09 MED ORDER — MIDAZOLAM HCL 2 MG/2ML IJ SOLN
2.0000 mg | INTRAMUSCULAR | Status: DC | PRN
  Administered 2020-02-09 (×2): 2 mg via INTRAVENOUS
  Filled 2020-02-09 (×2): qty 2

## 2020-02-09 MED ORDER — ACETAMINOPHEN 160 MG/5ML PO SOLN: 1000.0000 mg | Freq: Four times a day (QID) | ORAL | Status: DC

## 2020-02-09 MED ORDER — METOPROLOL TARTRATE 25 MG/10 ML ORAL SUSPENSION
12.5000 mg | Freq: Two times a day (BID) | ORAL | Status: DC
  Administered 2020-02-09: 12.5 mg
  Filled 2020-02-09: qty 5

## 2020-02-09 MED ORDER — ACETAMINOPHEN 650 MG RE SUPP
650.0000 mg | Freq: Once | RECTAL | Status: AC
  Administered 2020-02-09: 650 mg via RECTAL

## 2020-02-09 MED ORDER — VANCOMYCIN HCL IN DEXTROSE 1-5 GM/200ML-% IV SOLN
1000.0000 mg | Freq: Once | INTRAVENOUS | Status: AC
  Administered 2020-02-09: 1000 mg via INTRAVENOUS
  Filled 2020-02-09: qty 200

## 2020-02-09 MED ORDER — MORPHINE SULFATE (PF) 2 MG/ML IV SOLN
1.0000 mg | INTRAVENOUS | Status: DC | PRN
  Administered 2020-02-09 – 2020-02-10 (×4): 2 mg via INTRAVENOUS
  Filled 2020-02-09 (×4): qty 1

## 2020-02-09 MED ORDER — MAGNESIUM SULFATE 4 GM/100ML IV SOLN
4.0000 g | Freq: Once | INTRAVENOUS | Status: AC
  Administered 2020-02-09: 4 g via INTRAVENOUS
  Filled 2020-02-09: qty 100

## 2020-02-09 MED ORDER — INSULIN ASPART 100 UNIT/ML ~~LOC~~ SOLN
0.0000 [IU] | SUBCUTANEOUS | Status: DC
  Administered 2020-02-10: 2 [IU] via SUBCUTANEOUS

## 2020-02-09 MED ORDER — ALBUMIN HUMAN 5 % IV SOLN
250.0000 mL | INTRAVENOUS | Status: AC | PRN
  Administered 2020-02-09: 12.5 g via INTRAVENOUS

## 2020-02-09 MED ORDER — NICARDIPINE HCL IN NACL 20-0.86 MG/200ML-% IV SOLN
3.0000 mg/h | INTRAVENOUS | Status: DC
  Administered 2020-02-09: 5 mg/h via INTRAVENOUS
  Filled 2020-02-09: qty 200

## 2020-02-09 MED ORDER — COAGULATION FACTOR VIIA RECOMB 1 MG IV SOLR: 45.0000 ug/kg | Freq: Once | INTRAVENOUS | Status: DC

## 2020-02-09 MED ORDER — FAMOTIDINE IN NACL 20-0.9 MG/50ML-% IV SOLN
20.0000 mg | Freq: Two times a day (BID) | INTRAVENOUS | Status: AC
  Administered 2020-02-09 (×2): 20 mg via INTRAVENOUS
  Filled 2020-02-09 (×2): qty 50

## 2020-02-09 MED ORDER — BISACODYL 5 MG PO TBEC
10.0000 mg | DELAYED_RELEASE_TABLET | Freq: Every day | ORAL | Status: DC
  Administered 2020-02-09 – 2020-02-11 (×3): 10 mg via ORAL
  Filled 2020-02-09 (×3): qty 2

## 2020-02-09 MED ORDER — PROTAMINE SULFATE 10 MG/ML IV SOLN
INTRAVENOUS | Status: DC | PRN
  Administered 2020-02-09: 350 mg via INTRAVENOUS

## 2020-02-09 MED ORDER — SODIUM CHLORIDE 0.45 % IV SOLN: INTRAVENOUS | Status: DC | PRN

## 2020-02-09 MED ORDER — SODIUM CHLORIDE 0.9 % IV SOLN: 250.0000 mL | INTRAVENOUS | Status: DC

## 2020-02-09 MED ORDER — MILRINONE LACTATE IN DEXTROSE 20-5 MG/100ML-% IV SOLN
0.2500 ug/kg/min | INTRAVENOUS | Status: DC
  Administered 2020-02-09 – 2020-02-10 (×4): 0.25 ug/kg/min via INTRAVENOUS
  Filled 2020-02-09 (×3): qty 100

## 2020-02-09 MED ORDER — SODIUM CHLORIDE 0.9 % IV SOLN
1.5000 g | Freq: Two times a day (BID) | INTRAVENOUS | Status: AC
  Administered 2020-02-09 – 2020-02-10 (×4): 1.5 g via INTRAVENOUS
  Filled 2020-02-09 (×4): qty 1.5

## 2020-02-09 MED ORDER — BISACODYL 10 MG RE SUPP: 10.0000 mg | Freq: Every day | RECTAL | Status: DC

## 2020-02-09 MED ORDER — OXYCODONE HCL 5 MG PO TABS
5.0000 mg | ORAL_TABLET | ORAL | Status: DC | PRN
  Administered 2020-02-09 (×2): 5 mg via ORAL
  Administered 2020-02-10 – 2020-02-11 (×5): 10 mg via ORAL
  Filled 2020-02-09 (×2): qty 1
  Filled 2020-02-09 (×5): qty 2

## 2020-02-09 MED ORDER — DEXMEDETOMIDINE HCL IN NACL 400 MCG/100ML IV SOLN
0.0000 ug/kg/h | INTRAVENOUS | Status: DC
  Administered 2020-02-09 (×3): 0.7 ug/kg/h via INTRAVENOUS
  Filled 2020-02-09: qty 100

## 2020-02-09 MED ORDER — SODIUM CHLORIDE 0.9 % IV SOLN: INTRAVENOUS | Status: DC

## 2020-02-09 MED ORDER — INSULIN REGULAR(HUMAN) IN NACL 100-0.9 UT/100ML-% IV SOLN
INTRAVENOUS | Status: DC
  Administered 2020-02-09: 2.2 [IU]/h via INTRAVENOUS

## 2020-02-09 MED ORDER — SODIUM CHLORIDE 0.9% FLUSH: 3.0000 mL | INTRAVENOUS | Status: DC | PRN

## 2020-02-09 MED ORDER — SODIUM CHLORIDE 0.9% FLUSH
10.0000 mL | Freq: Two times a day (BID) | INTRAVENOUS | Status: DC
  Administered 2020-02-09 – 2020-02-10 (×4): 10 mL

## 2020-02-09 MED ORDER — ASPIRIN EC 325 MG PO TBEC
325.0000 mg | DELAYED_RELEASE_TABLET | Freq: Every day | ORAL | Status: DC
  Administered 2020-02-09 – 2020-02-11 (×3): 325 mg via ORAL
  Filled 2020-02-09 (×3): qty 1

## 2020-02-09 MED ORDER — CHLORHEXIDINE GLUCONATE CLOTH 2 % EX PADS
6.0000 | MEDICATED_PAD | Freq: Every day | CUTANEOUS | Status: DC
  Administered 2020-02-09 – 2020-02-11 (×3): 6 via TOPICAL

## 2020-02-09 MED ORDER — MIDAZOLAM HCL 2 MG/2ML IJ SOLN: 2.0000 mg | INTRAMUSCULAR | Status: DC | PRN

## 2020-02-09 MED ORDER — POTASSIUM CHLORIDE 10 MEQ/50ML IV SOLN: 10.0000 meq | INTRAVENOUS | Status: DC

## 2020-02-09 MED ORDER — METOPROLOL TARTRATE 12.5 MG HALF TABLET
12.5000 mg | ORAL_TABLET | Freq: Two times a day (BID) | ORAL | Status: DC
  Administered 2020-02-10: 12.5 mg via ORAL
  Filled 2020-02-09: qty 1

## 2020-02-09 MED ORDER — LACTATED RINGERS IV SOLN: INTRAVENOUS | Status: DC

## 2020-02-09 MED ORDER — COAGULATION FACTOR VIIA RECOMB 1 MG IV SOLR
45.0000 ug/kg | Freq: Once | INTRAVENOUS | Status: AC
  Administered 2020-02-09: 6000 ug via INTRAVENOUS
  Filled 2020-02-09: qty 5

## 2020-02-09 MED ORDER — DOCUSATE SODIUM 100 MG PO CAPS
200.0000 mg | ORAL_CAPSULE | Freq: Every day | ORAL | Status: DC
  Administered 2020-02-09 – 2020-02-11 (×3): 200 mg via ORAL
  Filled 2020-02-09 (×3): qty 2

## 2020-02-09 MED ORDER — TRAMADOL HCL 50 MG PO TABS
50.0000 mg | ORAL_TABLET | ORAL | Status: DC | PRN
  Administered 2020-02-09 – 2020-02-10 (×3): 100 mg via ORAL
  Filled 2020-02-09 (×3): qty 2

## 2020-02-09 MED ORDER — NOREPINEPHRINE 4 MG/250ML-% IV SOLN: 0.0000 ug/min | INTRAVENOUS | Status: DC

## 2020-02-09 MED ORDER — DEXTROSE 50 % IV SOLN: 0.0000 mL | INTRAVENOUS | Status: DC | PRN

## 2020-02-09 MED ORDER — SODIUM CHLORIDE 0.9% FLUSH
3.0000 mL | Freq: Two times a day (BID) | INTRAVENOUS | Status: DC
  Administered 2020-02-09 – 2020-02-11 (×5): 3 mL via INTRAVENOUS

## 2020-02-09 MED ORDER — PANTOPRAZOLE SODIUM 40 MG PO TBEC
40.0000 mg | DELAYED_RELEASE_TABLET | Freq: Every day | ORAL | Status: DC
  Administered 2020-02-10 – 2020-02-11 (×2): 40 mg via ORAL
  Filled 2020-02-09 (×2): qty 1

## 2020-02-09 MED ORDER — PHENYLEPHRINE HCL-NACL 20-0.9 MG/250ML-% IV SOLN: 0.0000 ug/min | INTRAVENOUS | Status: DC

## 2020-02-09 MED ORDER — NITROGLYCERIN IN D5W 200-5 MCG/ML-% IV SOLN
0.0000 ug/min | INTRAVENOUS | Status: DC
  Administered 2020-02-09: 25 ug/min via INTRAVENOUS

## 2020-02-09 MED ORDER — MELATONIN 5 MG PO TABS
5.0000 mg | ORAL_TABLET | Freq: Every day | ORAL | Status: DC
  Administered 2020-02-09 – 2020-02-12 (×4): 5 mg via ORAL
  Filled 2020-02-09 (×4): qty 1

## 2020-02-09 MED ORDER — ONDANSETRON HCL 4 MG/2ML IJ SOLN: 4.0000 mg | Freq: Four times a day (QID) | INTRAMUSCULAR | Status: DC | PRN

## 2020-02-09 MED ORDER — SODIUM CHLORIDE 0.9% FLUSH: 10.0000 mL | INTRAVENOUS | Status: DC | PRN

## 2020-02-09 MED ORDER — LACTATED RINGERS IV SOLN: 500.0000 mL | Freq: Once | INTRAVENOUS | Status: DC | PRN

## 2020-02-09 MED ORDER — ACETAMINOPHEN 160 MG/5ML PO SOLN: 650.0000 mg | Freq: Once | ORAL | Status: AC

## 2020-02-09 MED ORDER — ASPIRIN 81 MG PO CHEW: 324.0000 mg | CHEWABLE_TABLET | Freq: Every day | ORAL | Status: DC

## 2020-02-09 MED FILL — Nitroglycerin IV Soln 100 MCG/ML in D5W: INTRA_ARTERIAL | Qty: 10 | Status: AC

## 2020-02-09 NOTE — Progress Notes (Signed)
Cardiology Note: Pt out of the OR early this am from aortic dissection repair. Hemodynamically stable. A-paced at 80 bpm. Underlying sinus at approximately 60 bpm. Management per TCTS team. Wake up assessments done, patient currently on  and milrinone 0.25. Will follow.  Tonny Bollman 02/09/2020 8:38 AM

## 2020-02-09 NOTE — Brief Op Note (Signed)
02/08/2020 - 02/09/2020  6:23 PM  PATIENT:  Aaron [name]  34 y.o. male  PRE-OPERATIVE DIAGNOSIS:  AORTIC DISSECTION, Type A  POST-OPERATIVE DIAGNOSIS:  AORTIC DISSECTION  PROCEDURE:  Procedure(s): REPAIR OF TYPE A THORACIC AORTIC DISSECTION USING A 32 MM STRAIGHT GRAFT; CIRC ARREST; AND RIGHT AXILLARY ARTERY CANNULATION. (N/A) TRANSESOPHAGEAL ECHOCARDIOGRAM (TEE) (N/A)  SURGEON:  Surgeon(s) and Role: Panel 1:    * Kerin Perna, MD - Primary Panel 2:    * Kerin Perna, MD - Primary  PHYSICIAN ASSISTANT:   Romona Curls  PA   ANESTHESIA:   general  EBL:  1000 ml   BLOOD ADMINISTERED:2 U FFP and 2U PLTS  DRAINS: plural and mediastinal Chest Tube(s) in the chest   LOCAL MEDICATIONS USED:  NONE  SPECIMEN:  Excision  DISPOSITION OF SPECIMEN:  PATHOLOGY  COUNTS:  YES  TOURNIQUET:  * No tourniquets in log *  DICTATION: .Dragon Dictation  PLAN OF CARE: transfer to ICU  PATIENT DISPOSITION:  ICU - intubated and hemodynamically stable.   Delay start of Pharmacological VTE agent (>24hrs) due to surgical blood loss or risk of bleeding: yes

## 2020-02-09 NOTE — Progress Notes (Signed)
1 Day Post-Op Procedure(s) (LRB): REPAIR OF TYPE A THORACIC AORTIC DISSECTION USING A 32 MM STRAIGHT GRAFT; CIRC ARREST; AND RIGHT AXILLARY ARTERY CANNULATION. (N/A) TRANSESOPHAGEAL ECHOCARDIOGRAM (TEE) (N/A) Subjective: Extubated neuro intact nsr Objective: Vital signs in last 24 hours: Temp:  [93.6 F (34.2 C)-98.6 F (37 C)] 98 F (36.7 C) (08/20 1509) Pulse Rate:  [72-133] 89 (08/20 1800) Cardiac Rhythm: Atrial paced (08/20 0800) Resp:  [12-65] 28 (08/20 1300) BP: (80-192)/(45-84) 94/56 (08/20 1800) SpO2:  [94 %-100 %] 94 % (08/20 1800) Arterial Line BP: (105-279)/(47-273) 137/53 (08/20 1800) FiO2 (%):  [40 %-50 %] 40 % (08/20 0832)  Hemodynamic parameters for last 24 hours: PAP: (20-33)/(11-21) 22/11 CO:  [4.3 L/min-7.1 L/min] 7.1 L/min CI:  [1.7 L/min/m2-2.8 L/min/m2] 2.7 L/min/m2  Intake/Output from previous day: 08/19 0701 - 08/20 0700 In: 6416.2 [I.V.:3657.3; Blood:2092; IV Piggyback:666.9] Out: 4375 [Urine:2125; Blood:2000; Chest Tube:250] Intake/Output this shift: Total I/O In: 426.3 [I.V.:202.6; IV Piggyback:223.7] Out: 590 [Urine:390; Chest Tube:200]       Exam    General- alert and comfortable    Neck- no JVD, no cervical adenopathy palpable, no carotid bruit   Lungs- clear without rales, wheezes   Cor- regular rate and rhythm, no murmur , gallop   Abdomen- soft, non-tender   Extremities - warm, non-tender, minimal edema. Pulses intact   Neuro- oriented, appropriate, no focal weakness   Lab Results: Recent Labs    02/09/20 0651 02/09/20 0655 02/09/20 1631 02/09/20 1633  WBC 11.0*  --  12.6*  --   HGB 13.2   < > 12.7* 11.9*  HCT 38.1*   < > 36.8* 35.0*  PLT 118*  --  119*  --    < > = values in this interval not displayed.   BMET:  Recent Labs    02/09/20 0651 02/09/20 0655 02/09/20 1631 02/09/20 1633  NA 140   < > 140 141  K 4.6   < > 4.0 4.0  CL 108  --  107  --   CO2 25  --  24  --   GLUCOSE 137*  --  111*  --   BUN 11  --  13  --    CREATININE 1.47*  --  1.51*  --   CALCIUM 8.2*  --  8.5*  --    < > = values in this interval not displayed.    PT/INR:  Recent Labs    02/09/20 0230  LABPROT 11.5  INR 0.9   ABG    Component Value Date/Time   PHART 7.396 02/09/2020 1633   HCO3 25.1 02/09/2020 1633   TCO2 26 02/09/2020 1633   O2SAT 96.0 02/09/2020 1633   CBG (last 3)  Recent Labs    02/09/20 1111 02/09/20 1143 02/09/20 1507  GLUCAP 110* 107* 112*    Assessment/Plan: S/P Procedure(s) (LRB): REPAIR OF TYPE A THORACIC AORTIC DISSECTION USING A 32 MM STRAIGHT GRAFT; CIRC ARREST; AND RIGHT AXILLARY ARTERY CANNULATION. (N/A) TRANSESOPHAGEAL ECHOCARDIOGRAM (TEE) (N/A) Mobilize BP control   LOS: 0 days    Aaron [name] 02/09/2020

## 2020-02-09 NOTE — Transfer of Care (Signed)
Immediate Anesthesia Transfer of Care Note  Patient: Aaron [name]  Procedure(s) Performed: REPAIR OF TYPE A THORACIC AORTIC DISSECTION USING A 32 MM STRAIGHT GRAFT; CIRC ARREST; AND RIGHT AXILLARY ARTERY CANNULATION. (N/A Chest) TRANSESOPHAGEAL ECHOCARDIOGRAM (TEE) (N/A )  Patient Location: ICU  Anesthesia Type:General  Level of Consciousness: sedated  Airway & Oxygen Therapy: Patient remains intubated per anesthesia plan and Patient placed on Ventilator (see vital sign flow sheet for setting)  Post-op Assessment: Report given to RN and Post -op Vital signs reviewed and stable  Post vital signs: Reviewed and stable  Last Vitals:  Vitals Value Taken Time  BP    Temp    Pulse    Resp    SpO2      Last Pain:  Vitals:   02/08/20 1551  PainSc: 0-No pain         Complications: No complications documented.

## 2020-02-09 NOTE — Anesthesia Postprocedure Evaluation (Signed)
Anesthesia Post Note  Patient: Aaron [name]  Procedure(s) Performed: REPAIR OF TYPE A THORACIC AORTIC DISSECTION USING A 32 MM STRAIGHT GRAFT; CIRC ARREST; AND RIGHT AXILLARY ARTERY CANNULATION. (N/A Chest) TRANSESOPHAGEAL ECHOCARDIOGRAM (TEE) (N/A )     Patient location during evaluation: SICU Anesthesia Type: General Level of consciousness: sedated Pain management: pain level controlled Vital Signs Assessment: post-procedure vital signs reviewed and stable Respiratory status: patient remains intubated per anesthesia plan Cardiovascular status: stable Postop Assessment: no apparent nausea or vomiting Anesthetic complications: no   No complications documented.  Last Vitals:  Vitals:   02/09/20 0630 02/09/20 0637  BP:    Pulse: 84 84  Resp: 16 18  Temp: 36.9 C 36.9 C  SpO2: 100% 100%    Last Pain:  Vitals:   02/09/20 0230  TempSrc: Esophageal  PainSc:                  Cecile Hearing

## 2020-02-09 NOTE — Progress Notes (Signed)
21fr sheath aspirated and removed from right radial artery. TR band applied at 11cc. No s+s of hematoma. Patient intubated and sedated.   Brisk capillary refill present.  TR band on time 02:35:00

## 2020-02-09 NOTE — Progress Notes (Signed)
Patient ID: Aaron [name], male   DOB: 1986-01-14, 34 y.o.   MRN: [id number] [facility] Evening Rounds:  Hemodynamically stable on milrinone 0.25  Extubated today, sats 93%  Awake, alert, neuro intact.  Urine output good.  CT output low.  BMET    Component Value Date/Time   NA 141 02/09/2020 1633   K 4.0 02/09/2020 1633   CL 107 02/09/2020 1631   CO2 24 02/09/2020 1631   GLUCOSE 111 (H) 02/09/2020 1631   BUN 13 02/09/2020 1631   CREATININE 1.51 (H) 02/09/2020 1631   CALCIUM 8.5 (L) 02/09/2020 1631   GFRNONAA >60 02/09/2020 1631   GFRAA >60 02/09/2020 1631   CBC    Component Value Date/Time   WBC 12.6 (H) 02/09/2020 1631   RBC 4.05 (L) 02/09/2020 1631   HGB 11.9 (L) 02/09/2020 1633   HCT 35.0 (L) 02/09/2020 1633   PLT 119 (L) 02/09/2020 1631   MCV 90.9 02/09/2020 1631   MCH 31.4 02/09/2020 1631   MCHC 34.5 02/09/2020 1631   RDW 13.6 02/09/2020 1631   LYMPHSABS 3.0 12/31/2008 2333   MONOABS 0.6 12/31/2008 2333   EOSABS 0.3 12/31/2008 2333   BASOSABS 0.1 12/31/2008 2333

## 2020-02-09 NOTE — Procedures (Signed)
Extubation Procedure Note  Patient Details:   Name: Aaron [name] DOB: December 07, 1985 MRN: [id number]   Airway Documentation:    Vent end date: 02/09/20 Vent end time: 0856   Evaluation  O2 sats: stable throughout Complications: No apparent complications Patient did tolerate procedure well. Bilateral Breath Sounds: Clear, Diminished   Pt extubated to 2L [place] per rapid wean protocol. NIF was -40 and VC was 1.3L. Pt had positive cuff leak. No stridor noted. RT to continue to monitor.  Guss Bunde 02/09/2020, 8:57 AM

## 2020-02-10 ENCOUNTER — Inpatient Hospital Stay (HOSPITAL_COMMUNITY): Payer: Self-pay

## 2020-02-10 LAB — COMPREHENSIVE METABOLIC PANEL
ALT: 95 U/L — ABNORMAL HIGH (ref 0–44)
AST: 64 U/L — ABNORMAL HIGH (ref 15–41)
Albumin: 3 g/dL — ABNORMAL LOW (ref 3.5–5.0)
Alkaline Phosphatase: 35 U/L — ABNORMAL LOW (ref 38–126)
Anion gap: 10 (ref 5–15)
BUN: 9 mg/dL (ref 6–20)
CO2: 24 mmol/L (ref 22–32)
Calcium: 8.4 mg/dL — ABNORMAL LOW (ref 8.9–10.3)
Chloride: 103 mmol/L (ref 98–111)
Creatinine, Ser: 1.28 mg/dL — ABNORMAL HIGH (ref 0.61–1.24)
GFR calc Af Amer: >60 mL/min (ref >60)
GFR calc non Af Amer: >60 mL/min (ref >60)
Glucose, Bld: 124 mg/dL — ABNORMAL HIGH (ref 70–99)
Potassium: 3.6 mmol/L (ref 3.5–5.1)
Sodium: 137 mmol/L (ref 135–145)
Total Bilirubin: 1.5 mg/dL — ABNORMAL HIGH (ref 0.3–1.2)
Total Protein: 5.6 g/dL — ABNORMAL LOW (ref 6.5–8.1)

## 2020-02-10 LAB — CBC
HCT: 36.4 % — ABNORMAL LOW (ref 39.0–52.0)
Hemoglobin: 12.5 g/dL — ABNORMAL LOW (ref 13.0–17.0)
MCH: 31.5 pg (ref 26.0–34.0)
MCHC: 34.3 g/dL (ref 30.0–36.0)
MCV: 91.7 fL (ref 80.0–100.0)
Platelets: 127 10*3/uL — ABNORMAL LOW (ref 150–400)
RBC: 3.97 MIL/uL — ABNORMAL LOW (ref 4.22–5.81)
RDW: 13.5 % (ref 11.5–15.5)
WBC: 14.3 10*3/uL — ABNORMAL HIGH (ref 4.0–10.5)
nRBC: 0 % (ref 0.0–0.2)

## 2020-02-10 LAB — GLUCOSE, CAPILLARY
Glucose-Capillary: 100 mg/dL — ABNORMAL HIGH (ref 70–99)
Glucose-Capillary: 103 mg/dL — ABNORMAL HIGH (ref 70–99)
Glucose-Capillary: 104 mg/dL — ABNORMAL HIGH (ref 70–99)
Glucose-Capillary: 106 mg/dL — ABNORMAL HIGH (ref 70–99)
Glucose-Capillary: 107 mg/dL — ABNORMAL HIGH (ref 70–99)
Glucose-Capillary: 108 mg/dL — ABNORMAL HIGH (ref 70–99)
Glucose-Capillary: 120 mg/dL — ABNORMAL HIGH (ref 70–99)
Glucose-Capillary: 121 mg/dL — ABNORMAL HIGH (ref 70–99)

## 2020-02-10 MED ORDER — METOPROLOL TARTRATE 25 MG PO TABS
25.0000 mg | ORAL_TABLET | Freq: Two times a day (BID) | ORAL | Status: DC
  Administered 2020-02-10 – 2020-02-11 (×2): 25 mg via ORAL
  Filled 2020-02-10 (×2): qty 1

## 2020-02-10 MED ORDER — POTASSIUM CHLORIDE CRYS ER 20 MEQ PO TBCR
20.0000 meq | EXTENDED_RELEASE_TABLET | ORAL | Status: AC
  Administered 2020-02-10 (×3): 20 meq via ORAL
  Filled 2020-02-10 (×3): qty 1

## 2020-02-10 MED ORDER — FUROSEMIDE 10 MG/ML IJ SOLN
40.0000 mg | Freq: Once | INTRAMUSCULAR | Status: AC
  Administered 2020-02-10: 40 mg via INTRAVENOUS
  Filled 2020-02-10: qty 4

## 2020-02-10 MED ORDER — METOPROLOL TARTRATE 25 MG/10 ML ORAL SUSPENSION
25.0000 mg | Freq: Two times a day (BID) | ORAL | Status: DC
  Filled 2020-02-10: qty 10

## 2020-02-10 NOTE — Progress Notes (Signed)
Patient ID: Aaron [name], male   DOB: Jul 05, 1985, 34 y.o.   MRN: [id number] [facility] Evening Rounds:  Has been hemodynamically stable today. Off pressors.  Diuresing well.  Ambulated.

## 2020-02-10 NOTE — Op Note (Signed)
NAME: Aaron [name], Aaron [name] MEDICAL RECORD [id number] ACCOUNT [id number] DATE OF BIRTH:10-03-1985 FACILITY: [facility] LOCATION: [facility] PHYSICIAN:Yulisa Chirico VAN TRIGT III, MD  OPERATIVE REPORT  DATE OF PROCEDURE:  02/08/2020  OPERATION: 1.  Emergency repair of type A ascending aortic dissection using a 32 mm Hemashield graft from the sinotubular junction to the proximal arch. 2.  Right axillary artery cannulation and antegrade cerebral perfusion during hypothermic circulatory arrest.  SURGEON:  Kerin Perna, MD  ASSISTANT:  Gershon Crane, PA-C.  ANESTHESIA:  General by Dr. Desmond Lope.  PREOPERATIVE DIAGNOSES:  Acute type A ascending aortic dissection.  POSTOPERATIVE DIAGNOSES:  Acute type A ascending aortic dissection.  CLINICAL NOTE:  The patient is a 34 year old male with several days of chest and abdominal and back pain, nausea and heartburn and burping.  He was seen at an urgent care facility and treated for GERD.  He returned with increasing symptoms and was found  in the ED to have positive cardiac enzymes with ST segment changes.  There was concern that he had a ST elevation myocardial infarction and underwent cardiac catheterization.  This was approached via the right radial artery.  The first catheter  injection, however, showed the aorta was abnormal and was concerning for an aortic dissection.  The patient then went directly to CT scan without having coronary angiograms.  CT confirmed a type A aortic dissection, which extended the false lumen into  the upper abdominal aorta.  I discussed the results of the catheterization and CT scan with the patient in the cath lab and on the way to the operating room from the CT scanner.  He understood that this was an emergency operation for acute life-threatening condition that needed  cardiac surgery, several hours of cardiopulmonary bypass with cooling of the body for circulatory arrest and had risks of bleeding, stroke, organ failure, MI and death.   He agreed to proceed with surgery.  DESCRIPTION OF PROCEDURE:  The patient was brought directly from the CT scan to the operating room.  The anesthesia team immediately monitored and placed invasive lines for the surgery including a second left radial artery line, right IJ sheath with  pulmonary artery catheter.  The patient was induced for anesthesia and remained stable.  A Foley catheter was placed and the TEE probe was placed.  This showed the dissection with a false lumen intimal flap and the aortic root.  There was mild AI.  The patient was then prepped and draped as a sterile field.  A proper time-out was performed.  An incision was made in the right deltopectoral groove and the right axillary artery was identified.  It was fairly small, approximately 10 mm in diameter.  An 8 mm graft was sewn end-to-side with running 5-0 Prolene using the typical clamps and  retraction.  There was good hemostasis and the catheter was clamped and protected until the sternotomy.  Heparin 6000 units had been administered prior to the axillary artery cannulation.  A sternotomy was then performed.  The sternum was retracted.  The pericardium was opened and had a large amount of bloody pericardial fluid.  The aorta was enlarged with a hematoma.  The pericardium was suspended.  Pursestrings were placed in the right  atrium for cannulation as well as coronary sinus catheter placement.  The graft to the axillary artery was connected to the arterial line and volume was infused.  However, there was fairly high pressure.  It was decided to place a larger 10 mm graft in  the  innominate artery through the chest incision and this was performed before the patient went on bypass, using the same technique with a partial occlusion clamp on the innominate artery and end-to-side anastomosis with 5-0 Prolene of the 10 mm graft.   This larger graft was then also Y'd into the arterial circuit and there was good low pressure infusion  through this added cannula.  The venous cannula was then placed.  An LV vent was placed, and the patient was placed on cardiopulmonary bypass.  He was  then cooled down to eventually 22 degrees.  The retrograde coronary sinus cardioplegia cannula was then placed.  The patient was prepared for crossclamping by carefully dissecting the aorta with a large hematoma from the pulmonary artery.  A clamp was  placed beneath the arch and hypothermic cardioplegic arrest was achieved with cardioplegia through the coronary sinus catheter.  There was good cardioplegic arrest using the KLM cardioplegia solution.  Supple temperature dropped less than 15 degrees and  cardioplegia was readministered per protocol.  The aorta was transected beneath the cross clamp.  There is a large tear extending from the noncoronary sinus at the level of the sinotubular junction.  The aortic valve leaflets and aortic root were intact.  The torn area of the aorta was resected from  the sinotubular junction up to beneath the crossclamp.  It was sized with 32 mm graft.  This was sewn end-to-side after the hematoma was removed from the false lumen and the false lumen was obliterated with Teflon felt and BioGlue adhesive material.  The  anastomosis was created using a running 4-0 Prolene on the posterior wall and this was reinforced with several pledgeted 4-0 Prolene sutures along the posterior wall of the anastomosis.  The anastomosis was then completed with a running suture around  the anterior aspect of the graft and aorta and this was again reinforced with several interrupted pledgeted Prolene sutures.  A light layer of medical adhesive was placed on the anastomosis.  By this time, the patient had been cooled to 22 degrees.  The head was packed in ice and anesthesia provided steroids and propofol for hypothermic circulatory arrest.  Cerebral perfusion was monitored with the cerebral oximeter technology.  Blood was  drained from the patient  to the pump and the crossclamp was removed.  Antegrade cerebral perfusion through the axillary artery was then initiated after clamping the innominate artery proximal to the bifurcation of the innominate artery into the carotid  and subclavian vessels.  The anterior aspect of the arch was inspected and there was no tear.  The aorta was transected back to the proximal arch.  The graft was then cut to the appropriate angle and sized for an end-to-side anastomosis between graft and the proximal arch.  This  was again created with a running 4-0 Prolene on the posterior layer, reinforced with several interrupted 4-0 Prolene sutures.  The running suture was then carried around the anterior aspect of the anastomosis and then again reinforced with several  interrupted 4-0 Prolene sutures.  The graft was opened and a vent was placed and secured with a pledgeted Prolene stitch and a tourniquet.  The clamp was removed off the innominate artery and flow was then restored to the body and heart.  There was no  significant bleeding from the graft anastomoses and the patient was then slowly rewarmed.  The cardioplegia cannula was removed and the LV vent was also removed.  After over 90 minutes of rewarming, the patient  was reperfused and warm and ready to be  separated from cardiopulmonary bypass.  Low dose milrinone was started.  The lungs were expanded and the ventilator was resumed.  The patient was then transitioned off cardiopulmonary bypass without difficulty with stable hemodynamics.  Echo showed good  LV function with baseline minimal aortic insufficiency.  Protamine was administered and there was still diffuse coagulopathy.  The platelet count was low at 60,000 and the patient received blood products including platelets.  He still had coagulopathy  and a half dose of factor VII was administered for cardiac surgical coagulopathy.  This improved coagulation function.  The axillary artery incision was closed in the  standard technique and the graft was ligated and divided.  Skin was closed with staples.  The superior pericardial fat was closed over the aortic graft.  The anterior mediastinum and bilateral pleural tubes were placed.  Pacing wires were placed.  The patient remained stable off bypass.  The sternal wires were placed and pulled together.  The  patient remained stable.  The pectoralis fascia, subcutaneous and skin layers were closed in a standard fashion.  The patient then returned to the ICU in stable condition.  Total cardiopulmonary bypass time was 220 minutes and circulatory arrest time was 61 minutes.  PN/NUANCE  D:02/09/2020 T:02/10/2020 JOB:[id number]

## 2020-02-10 NOTE — Progress Notes (Signed)
2 Days Post-Op Procedure(s) (LRB): REPAIR OF TYPE A THORACIC AORTIC DISSECTION USING A 32 MM STRAIGHT GRAFT; CIRC ARREST; AND RIGHT AXILLARY ARTERY CANNULATION. (N/A) TRANSESOPHAGEAL ECHOCARDIOGRAM (TEE) (N/A) Subjective:  Complains of chest wall pain but otherwise ok. Ambulated this am.  Objective: Vital signs in last 24 hours: Temp:  [98 F (36.7 C)-99.7 F (37.6 C)] 99.1 F (37.3 C) (08/21 0655) Pulse Rate:  [72-95] 93 (08/21 1000) Cardiac Rhythm: Normal sinus rhythm (08/21 0800) Resp:  [19-65] 30 (08/21 1000) BP: (80-159)/(53-70) 143/70 (08/21 1000) SpO2:  [93 %-100 %] 96 % (08/21 1000) Arterial Line BP: (112-253)/(47-89) 181/89 (08/21 1000) Weight:  [657 kg] 126 kg (08/21 0438)  Hemodynamic parameters for last 24 hours:    Intake/Output from previous day: 08/20 0701 - 08/21 0700 In: 1194.4 [I.V.:770.7; IV Piggyback:423.7] Out: 1040 [Urine:720; Chest Tube:320] Intake/Output this shift: Total I/O In: 570.1 [P.O.:480; I.V.:90.1] Out: 175 [Urine:125; Chest Tube:50]  General appearance: alert and cooperative Neurologic: intact Heart: regular rate and rhythm, S1, S2 normal, no murmur, click, rub or gallop Lungs: clear to auscultation bilaterally Extremities: extremities normal, atraumatic, no cyanosis or edema Wound: dressings dry  Lab Results: Recent Labs    02/09/20 1631 02/09/20 1631 02/09/20 1633 02/10/20 0413  WBC 12.6*  --   --  14.3*  HGB 12.7*   < > 11.9* 12.5*  HCT 36.8*   < > 35.0* 36.4*  PLT 119*  --   --  127*   < > = values in this interval not displayed.   BMET:  Recent Labs    02/09/20 1631 02/09/20 1631 02/09/20 1633 02/10/20 0413  NA 140   < > 141 137  K 4.0   < > 4.0 3.6  CL 107  --   --  103  CO2 24  --   --  24  GLUCOSE 111*  --   --  124*  BUN 13  --   --  9  CREATININE 1.51*  --   --  1.28*  CALCIUM 8.5*  --   --  8.4*   < > = values in this interval not displayed.    PT/INR:  Recent Labs    02/09/20 0230  LABPROT 11.5   INR 0.9   ABG    Component Value Date/Time   PHART 7.396 02/09/2020 1633   HCO3 25.1 02/09/2020 1633   TCO2 26 02/09/2020 1633   O2SAT 96.0 02/09/2020 1633   CBG (last 3)  Recent Labs    02/09/20 2352 02/10/20 0445 02/10/20 0656  GLUCAP 104* 120* 121*   CXR: clear  Assessment/Plan: S/P Procedure(s) (LRB): REPAIR OF TYPE A THORACIC AORTIC DISSECTION USING A 32 MM STRAIGHT GRAFT; CIRC ARREST; AND RIGHT AXILLARY ARTERY CANNULATION. (N/A) TRANSESOPHAGEAL ECHOCARDIOGRAM (TEE) (N/A)  POD 2 Hemodynamically stable in sinus rhythm. DC milrinone. Continue Lopressor. DC arterial line.  DC chest tubes.  Diurese some. He is way below recorded preop wt so not sure what accurate baseline is.   Continue IS, ambulation.  LOS: 1 day    Alleen Borne 02/10/2020

## 2020-02-11 ENCOUNTER — Inpatient Hospital Stay (HOSPITAL_COMMUNITY): Payer: Self-pay

## 2020-02-11 LAB — GLUCOSE, CAPILLARY
Glucose-Capillary: 93 mg/dL (ref 70–99)
Glucose-Capillary: 145 mg/dL — ABNORMAL HIGH (ref 70–99)
Glucose-Capillary: 104 mg/dL — ABNORMAL HIGH (ref 70–99)
Glucose-Capillary: 82 mg/dL (ref 70–99)

## 2020-02-11 LAB — BASIC METABOLIC PANEL
Anion gap: 9 (ref 5–15)
BUN: 6 mg/dL (ref 6–20)
CO2: 27 mmol/L (ref 22–32)
Calcium: 8.6 mg/dL — ABNORMAL LOW (ref 8.9–10.3)
Chloride: 99 mmol/L (ref 98–111)
Creatinine, Ser: 1.22 mg/dL (ref 0.61–1.24)
GFR calc Af Amer: >60 mL/min (ref >60)
GFR calc non Af Amer: >60 mL/min (ref >60)
Glucose, Bld: 110 mg/dL — ABNORMAL HIGH (ref 70–99)
Potassium: 3.6 mmol/L (ref 3.5–5.1)
Sodium: 135 mmol/L (ref 135–145)

## 2020-02-11 MED ORDER — ACETAMINOPHEN 325 MG PO TABS: 650.0000 mg | ORAL_TABLET | Freq: Four times a day (QID) | ORAL | Status: DC | PRN

## 2020-02-11 MED ORDER — TRAMADOL HCL 50 MG PO TABS: 50.0000 mg | ORAL_TABLET | ORAL | Status: DC | PRN

## 2020-02-11 MED ORDER — SODIUM CHLORIDE 0.9% FLUSH: 3.0000 mL | INTRAVENOUS | Status: DC | PRN

## 2020-02-11 MED ORDER — POTASSIUM CHLORIDE CRYS ER 20 MEQ PO TBCR
20.0000 meq | EXTENDED_RELEASE_TABLET | ORAL | Status: AC
  Administered 2020-02-11 (×3): 20 meq via ORAL
  Filled 2020-02-11 (×3): qty 1

## 2020-02-11 MED ORDER — ASPIRIN EC 325 MG PO TBEC
325.0000 mg | DELAYED_RELEASE_TABLET | Freq: Every day | ORAL | Status: DC
  Administered 2020-02-12 – 2020-02-13 (×2): 325 mg via ORAL
  Filled 2020-02-11 (×2): qty 1

## 2020-02-11 MED ORDER — ~~LOC~~ CARDIAC SURGERY, PATIENT & FAMILY EDUCATION: Freq: Once | Status: AC

## 2020-02-11 MED ORDER — ONDANSETRON HCL 4 MG/2ML IJ SOLN: 4.0000 mg | Freq: Four times a day (QID) | INTRAMUSCULAR | Status: DC | PRN

## 2020-02-11 MED ORDER — SODIUM CHLORIDE 0.9 % IV SOLN: 250.0000 mL | INTRAVENOUS | Status: DC | PRN

## 2020-02-11 MED ORDER — SENNOSIDES-DOCUSATE SODIUM 8.6-50 MG PO TABS: 1.0000 | ORAL_TABLET | Freq: Two times a day (BID) | ORAL | Status: DC | PRN

## 2020-02-11 MED ORDER — SODIUM CHLORIDE 0.9% FLUSH
3.0000 mL | Freq: Two times a day (BID) | INTRAVENOUS | Status: DC
  Administered 2020-02-11 – 2020-02-13 (×4): 3 mL via INTRAVENOUS

## 2020-02-11 MED ORDER — PANTOPRAZOLE SODIUM 40 MG PO TBEC
40.0000 mg | DELAYED_RELEASE_TABLET | Freq: Every day | ORAL | Status: DC
  Administered 2020-02-12 – 2020-02-13 (×2): 40 mg via ORAL
  Filled 2020-02-11 (×2): qty 1

## 2020-02-11 MED ORDER — ONDANSETRON HCL 4 MG PO TABS: 4.0000 mg | ORAL_TABLET | Freq: Four times a day (QID) | ORAL | Status: DC | PRN

## 2020-02-11 MED ORDER — OXYCODONE HCL 5 MG PO TABS
5.0000 mg | ORAL_TABLET | ORAL | Status: DC | PRN
  Administered 2020-02-11 – 2020-02-12 (×4): 10 mg via ORAL
  Filled 2020-02-11 (×4): qty 2

## 2020-02-11 MED ORDER — METOPROLOL TARTRATE 25 MG PO TABS
25.0000 mg | ORAL_TABLET | Freq: Two times a day (BID) | ORAL | Status: DC
  Administered 2020-02-11 – 2020-02-13 (×4): 25 mg via ORAL
  Filled 2020-02-11 (×4): qty 1

## 2020-02-11 NOTE — Progress Notes (Signed)
Patient to room 4E17 from 2H. Vital signs obtained. On monitor CCMD notified. CHG bath completed. Alert and oriented to room and call light. Call bell within reach.  Sabra Heck, RN

## 2020-02-11 NOTE — Progress Notes (Signed)
3 Days Post-Op Procedure(s) (LRB): REPAIR OF TYPE A THORACIC AORTIC DISSECTION USING A 32 MM STRAIGHT GRAFT; CIRC ARREST; AND RIGHT AXILLARY ARTERY CANNULATION. (N/A) TRANSESOPHAGEAL ECHOCARDIOGRAM (TEE) (N/A) Subjective: No complaints. Pain under good control.  Objective: Vital signs in last 24 hours: Temp:  [98.5 F (36.9 C)-100.3 F (37.9 C)] 100.3 F (37.9 C) (08/22 0758) Pulse Rate:  [73-152] 88 (08/22 0500) Cardiac Rhythm: Normal sinus rhythm (08/22 0800) Resp:  [23-30] 23 (08/21 1100) BP: (100-153)/(56-72) 123/67 (08/22 0500) SpO2:  [90 %-98 %] 92 % (08/22 0500) Arterial Line BP: (181)/(89) 181/89 (08/21 1000) Weight:  [127.1 kg] 127.1 kg (08/22 0500)  Hemodynamic parameters for last 24 hours:    Intake/Output from previous day: 08/21 0701 - 08/22 0700 In: 1065.8 [P.O.:960; I.V.:105.8] Out: 2490 [Urine:2440; Chest Tube:50] Intake/Output this shift: No intake/output data recorded.  General appearance: alert and cooperative Neurologic: intact Heart: regular rate and rhythm, S1, S2 normal, no murmur Lungs: clear to auscultation bilaterally Extremities: no edema Wound: dressings dry  Lab Results: Recent Labs    02/09/20 1631 02/09/20 1631 02/09/20 1633 02/10/20 0413  WBC 12.6*  --   --  14.3*  HGB 12.7*   < > 11.9* 12.5*  HCT 36.8*   < > 35.0* 36.4*  PLT 119*  --   --  127*   < > = values in this interval not displayed.   BMET:  Recent Labs    02/10/20 0413 02/11/20 0047  NA 137 135  K 3.6 3.6  CL 103 99  CO2 24 27  GLUCOSE 124* 110*  BUN 9 6  CREATININE 1.28* 1.22  CALCIUM 8.4* 8.6*    PT/INR:  Recent Labs    02/09/20 0230  LABPROT 11.5  INR 0.9   ABG    Component Value Date/Time   PHART 7.396 02/09/2020 1633   HCO3 25.1 02/09/2020 1633   TCO2 26 02/09/2020 1633   O2SAT 96.0 02/09/2020 1633   CBG (last 3)  Recent Labs    02/10/20 2330 02/11/20 0350 02/11/20 0724  GLUCAP 103* 93 145*   CXR: mild bibasilar  atelectasis  Assessment/Plan: S/P Procedure(s) (LRB): REPAIR OF TYPE A THORACIC AORTIC DISSECTION USING A 32 MM STRAIGHT GRAFT; CIRC ARREST; AND RIGHT AXILLARY ARTERY CANNULATION. (N/A) TRANSESOPHAGEAL ECHOCARDIOGRAM (TEE) (N/A)  POD 3 Hemodynamically stable in sinus rhythm. Continue Lopressor.    Weight is below preop. He has no edema.  Continue IS, ambulation.  Transfer to 4E.   LOS: 2 days    Alleen Borne 02/11/2020

## 2020-02-12 ENCOUNTER — Inpatient Hospital Stay (HOSPITAL_COMMUNITY): Payer: Self-pay

## 2020-02-12 DIAGNOSIS — R030 Elevated blood-pressure reading, without diagnosis of hypertension: Secondary | ICD-10-CM

## 2020-02-12 DIAGNOSIS — F17200 Nicotine dependence, unspecified, uncomplicated: Secondary | ICD-10-CM

## 2020-02-12 LAB — BPAM RBC
Blood Product Expiration Date: 202109032359
Blood Product Expiration Date: 202109052359
Blood Product Expiration Date: 202109072359
Blood Product Expiration Date: 202109072359
Blood Product Expiration Date: 202109142359
Blood Product Expiration Date: 202109152359
Blood Product Expiration Date: 202109192359
Blood Product Expiration Date: 202109212359
ISSUE DATE / TIME: 202108191654
ISSUE DATE / TIME: 202108191654
ISSUE DATE / TIME: 202108191654
ISSUE DATE / TIME: 202108191654
Unit Type and Rh: 1700
Unit Type and Rh: 1700
Unit Type and Rh: 1700
Unit Type and Rh: 1700
Unit Type and Rh: 1700
Unit Type and Rh: 1700
Unit Type and Rh: 1700
Unit Type and Rh: 1700

## 2020-02-12 LAB — TYPE AND SCREEN
ABO/RH(D): B NEG
Antibody Screen: NEGATIVE
Unit division: 0
Unit division: 0
Unit division: 0
Unit division: 0
Unit division: 0
Unit division: 0
Unit division: 0
Unit division: 0

## 2020-02-12 LAB — POCT I-STAT 7, (LYTES, BLD GAS, ICA,H+H)
Acid-Base Excess: 0 mmol/L (ref 0.0–2.0)
Acid-Base Excess: 1 mmol/L (ref 0.0–2.0)
Acid-Base Excess: 0 mmol/L (ref 0.0–2.0)
Acid-base deficit: 1 mmol/L (ref 0.0–2.0)
Acid-base deficit: 3 mmol/L — ABNORMAL HIGH (ref 0.0–2.0)
Acid-base deficit: 4 mmol/L — ABNORMAL HIGH (ref 0.0–2.0)
Acid-base deficit: 7 mmol/L — ABNORMAL HIGH (ref 0.0–2.0)
Bicarbonate: 21.7 mmol/L (ref 20.0–28.0)
Bicarbonate: 21.9 mmol/L (ref 20.0–28.0)
Bicarbonate: 22.6 mmol/L (ref 20.0–28.0)
Bicarbonate: 23.3 mmol/L (ref 20.0–28.0)
Bicarbonate: 26.2 mmol/L (ref 20.0–28.0)
Bicarbonate: 27.1 mmol/L (ref 20.0–28.0)
Bicarbonate: 27.4 mmol/L (ref 20.0–28.0)
Calcium, Ion: 0.79 mmol/L — CL (ref 1.15–1.40)
Calcium, Ion: 0.95 mmol/L — ABNORMAL LOW (ref 1.15–1.40)
Calcium, Ion: 1.03 mmol/L — ABNORMAL LOW (ref 1.15–1.40)
Calcium, Ion: 1.05 mmol/L — ABNORMAL LOW (ref 1.15–1.40)
Calcium, Ion: 1.14 mmol/L — ABNORMAL LOW (ref 1.15–1.40)
Calcium, Ion: 1.21 mmol/L (ref 1.15–1.40)
Calcium, Ion: 1.26 mmol/L (ref 1.15–1.40)
HCT: 30 % — ABNORMAL LOW (ref 39.0–52.0)
HCT: 31 % — ABNORMAL LOW (ref 39.0–52.0)
HCT: 31 % — ABNORMAL LOW (ref 39.0–52.0)
HCT: 33 % — ABNORMAL LOW (ref 39.0–52.0)
HCT: 35 % — ABNORMAL LOW (ref 39.0–52.0)
HCT: 37 % — ABNORMAL LOW (ref 39.0–52.0)
HCT: 39 % (ref 39.0–52.0)
Hemoglobin: 10.2 g/dL — ABNORMAL LOW (ref 13.0–17.0)
Hemoglobin: 10.5 g/dL — ABNORMAL LOW (ref 13.0–17.0)
Hemoglobin: 10.5 g/dL — ABNORMAL LOW (ref 13.0–17.0)
Hemoglobin: 11.2 g/dL — ABNORMAL LOW (ref 13.0–17.0)
Hemoglobin: 11.9 g/dL — ABNORMAL LOW (ref 13.0–17.0)
Hemoglobin: 12.6 g/dL — ABNORMAL LOW (ref 13.0–17.0)
Hemoglobin: 13.3 g/dL (ref 13.0–17.0)
O2 Saturation: 100 %
O2 Saturation: 100 %
O2 Saturation: 100 %
O2 Saturation: 100 %
O2 Saturation: 100 %
O2 Saturation: 100 %
O2 Saturation: 99 %
Potassium: 4.3 mmol/L (ref 3.5–5.1)
Potassium: 4.3 mmol/L (ref 3.5–5.1)
Potassium: 4.3 mmol/L (ref 3.5–5.1)
Potassium: 4.6 mmol/L (ref 3.5–5.1)
Potassium: 4.7 mmol/L (ref 3.5–5.1)
Potassium: 5.1 mmol/L (ref 3.5–5.1)
Potassium: 5.2 mmol/L — ABNORMAL HIGH (ref 3.5–5.1)
Sodium: 136 mmol/L (ref 135–145)
Sodium: 137 mmol/L (ref 135–145)
Sodium: 137 mmol/L (ref 135–145)
Sodium: 140 mmol/L (ref 135–145)
Sodium: 141 mmol/L (ref 135–145)
Sodium: 141 mmol/L (ref 135–145)
Sodium: 141 mmol/L (ref 135–145)
TCO2: 23 mmol/L (ref 22–32)
TCO2: 24 mmol/L (ref 22–32)
TCO2: 24 mmol/L (ref 22–32)
TCO2: 24 mmol/L (ref 22–32)
TCO2: 28 mmol/L (ref 22–32)
TCO2: 28 mmol/L (ref 22–32)
TCO2: 29 mmol/L (ref 22–32)
pCO2 arterial: 37.8 mmHg (ref 32.0–48.0)
pCO2 arterial: 39.4 mmHg (ref 32.0–48.0)
pCO2 arterial: 40.5 mmHg (ref 32.0–48.0)
pCO2 arterial: 47 mmHg (ref 32.0–48.0)
pCO2 arterial: 47.9 mmHg (ref 32.0–48.0)
pCO2 arterial: 53.3 mmHg — ABNORMAL HIGH (ref 32.0–48.0)
pCO2 arterial: 58.5 mmHg — ABNORMAL HIGH (ref 32.0–48.0)
pH, Arterial: 7.182 — CL (ref 7.350–7.450)
pH, Arterial: 7.319 — ABNORMAL LOW (ref 7.350–7.450)
pH, Arterial: 7.337 — ABNORMAL LOW (ref 7.350–7.450)
pH, Arterial: 7.346 — ABNORMAL LOW (ref 7.350–7.450)
pH, Arterial: 7.367 (ref 7.350–7.450)
pH, Arterial: 7.368 (ref 7.350–7.450)
pH, Arterial: 7.398 (ref 7.350–7.450)
pO2, Arterial: 141 mmHg — ABNORMAL HIGH (ref 83.0–108.0)
pO2, Arterial: 218 mmHg — ABNORMAL HIGH (ref 83.0–108.0)
pO2, Arterial: 329 mmHg — ABNORMAL HIGH (ref 83.0–108.0)
pO2, Arterial: 371 mmHg — ABNORMAL HIGH (ref 83.0–108.0)
pO2, Arterial: 471 mmHg — ABNORMAL HIGH (ref 83.0–108.0)
pO2, Arterial: 494 mmHg — ABNORMAL HIGH (ref 83.0–108.0)
pO2, Arterial: 519 mmHg — ABNORMAL HIGH (ref 83.0–108.0)

## 2020-02-12 LAB — COMPREHENSIVE METABOLIC PANEL
ALT: 43 U/L (ref 0–44)
AST: 22 U/L (ref 15–41)
Albumin: 2.7 g/dL — ABNORMAL LOW (ref 3.5–5.0)
Alkaline Phosphatase: 41 U/L (ref 38–126)
Anion gap: 7 (ref 5–15)
BUN: 9 mg/dL (ref 6–20)
CO2: 28 mmol/L (ref 22–32)
Calcium: 8.7 mg/dL — ABNORMAL LOW (ref 8.9–10.3)
Chloride: 102 mmol/L (ref 98–111)
Creatinine, Ser: 1.19 mg/dL (ref 0.61–1.24)
GFR calc Af Amer: >60 mL/min (ref >60)
GFR calc non Af Amer: >60 mL/min (ref >60)
Glucose, Bld: 107 mg/dL — ABNORMAL HIGH (ref 70–99)
Potassium: 3.9 mmol/L (ref 3.5–5.1)
Sodium: 137 mmol/L (ref 135–145)
Total Bilirubin: 1.2 mg/dL (ref 0.3–1.2)
Total Protein: 5.9 g/dL — ABNORMAL LOW (ref 6.5–8.1)

## 2020-02-12 LAB — POCT I-STAT, CHEM 8
BUN: 10 mg/dL (ref 6–20)
BUN: 10 mg/dL (ref 6–20)
BUN: 10 mg/dL (ref 6–20)
BUN: 11 mg/dL (ref 6–20)
BUN: 11 mg/dL (ref 6–20)
BUN: 11 mg/dL (ref 6–20)
BUN: 9 mg/dL (ref 6–20)
Calcium, Ion: 0.8 mmol/L — CL (ref 1.15–1.40)
Calcium, Ion: 0.94 mmol/L — ABNORMAL LOW (ref 1.15–1.40)
Calcium, Ion: 1.01 mmol/L — ABNORMAL LOW (ref 1.15–1.40)
Calcium, Ion: 1.03 mmol/L — ABNORMAL LOW (ref 1.15–1.40)
Calcium, Ion: 1.06 mmol/L — ABNORMAL LOW (ref 1.15–1.40)
Calcium, Ion: 1.19 mmol/L (ref 1.15–1.40)
Calcium, Ion: 1.37 mmol/L (ref 1.15–1.40)
Chloride: 100 mmol/L (ref 98–111)
Chloride: 102 mmol/L (ref 98–111)
Chloride: 103 mmol/L (ref 98–111)
Chloride: 103 mmol/L (ref 98–111)
Chloride: 104 mmol/L (ref 98–111)
Chloride: 104 mmol/L (ref 98–111)
Chloride: 107 mmol/L (ref 98–111)
Creatinine, Ser: 1.1 mg/dL (ref 0.61–1.24)
Creatinine, Ser: 1.1 mg/dL (ref 0.61–1.24)
Creatinine, Ser: 1.2 mg/dL (ref 0.61–1.24)
Creatinine, Ser: 1.2 mg/dL (ref 0.61–1.24)
Creatinine, Ser: 1.3 mg/dL — ABNORMAL HIGH (ref 0.61–1.24)
Creatinine, Ser: 1.3 mg/dL — ABNORMAL HIGH (ref 0.61–1.24)
Creatinine, Ser: 1.4 mg/dL — ABNORMAL HIGH (ref 0.61–1.24)
Glucose, Bld: 108 mg/dL — ABNORMAL HIGH (ref 70–99)
Glucose, Bld: 109 mg/dL — ABNORMAL HIGH (ref 70–99)
Glucose, Bld: 112 mg/dL — ABNORMAL HIGH (ref 70–99)
Glucose, Bld: 114 mg/dL — ABNORMAL HIGH (ref 70–99)
Glucose, Bld: 139 mg/dL — ABNORMAL HIGH (ref 70–99)
Glucose, Bld: 161 mg/dL — ABNORMAL HIGH (ref 70–99)
Glucose, Bld: 181 mg/dL — ABNORMAL HIGH (ref 70–99)
HCT: 26 % — ABNORMAL LOW (ref 39.0–52.0)
HCT: 26 % — ABNORMAL LOW (ref 39.0–52.0)
HCT: 27 % — ABNORMAL LOW (ref 39.0–52.0)
HCT: 28 % — ABNORMAL LOW (ref 39.0–52.0)
HCT: 32 % — ABNORMAL LOW (ref 39.0–52.0)
HCT: 36 % — ABNORMAL LOW (ref 39.0–52.0)
HCT: 36 % — ABNORMAL LOW (ref 39.0–52.0)
Hemoglobin: 10.9 g/dL — ABNORMAL LOW (ref 13.0–17.0)
Hemoglobin: 12.2 g/dL — ABNORMAL LOW (ref 13.0–17.0)
Hemoglobin: 12.2 g/dL — ABNORMAL LOW (ref 13.0–17.0)
Hemoglobin: 8.8 g/dL — ABNORMAL LOW (ref 13.0–17.0)
Hemoglobin: 8.8 g/dL — ABNORMAL LOW (ref 13.0–17.0)
Hemoglobin: 9.2 g/dL — ABNORMAL LOW (ref 13.0–17.0)
Hemoglobin: 9.5 g/dL — ABNORMAL LOW (ref 13.0–17.0)
Potassium: 4.2 mmol/L (ref 3.5–5.1)
Potassium: 4.3 mmol/L (ref 3.5–5.1)
Potassium: 4.6 mmol/L (ref 3.5–5.1)
Potassium: 4.6 mmol/L (ref 3.5–5.1)
Potassium: 5.1 mmol/L (ref 3.5–5.1)
Potassium: 5.2 mmol/L — ABNORMAL HIGH (ref 3.5–5.1)
Potassium: 5.2 mmol/L — ABNORMAL HIGH (ref 3.5–5.1)
Sodium: 133 mmol/L — ABNORMAL LOW (ref 135–145)
Sodium: 134 mmol/L — ABNORMAL LOW (ref 135–145)
Sodium: 135 mmol/L (ref 135–145)
Sodium: 135 mmol/L (ref 135–145)
Sodium: 138 mmol/L (ref 135–145)
Sodium: 140 mmol/L (ref 135–145)
Sodium: 141 mmol/L (ref 135–145)
TCO2: 22 mmol/L (ref 22–32)
TCO2: 23 mmol/L (ref 22–32)
TCO2: 23 mmol/L (ref 22–32)
TCO2: 25 mmol/L (ref 22–32)
TCO2: 27 mmol/L (ref 22–32)
TCO2: 27 mmol/L (ref 22–32)
TCO2: 28 mmol/L (ref 22–32)

## 2020-02-12 LAB — CBC
HCT: 33.9 % — ABNORMAL LOW (ref 39.0–52.0)
Hemoglobin: 11.4 g/dL — ABNORMAL LOW (ref 13.0–17.0)
MCH: 31.6 pg (ref 26.0–34.0)
MCHC: 33.6 g/dL (ref 30.0–36.0)
MCV: 93.9 fL (ref 80.0–100.0)
Platelets: 151 10*3/uL (ref 150–400)
RBC: 3.61 MIL/uL — ABNORMAL LOW (ref 4.22–5.81)
RDW: 12.6 % (ref 11.5–15.5)
WBC: 11.3 10*3/uL — ABNORMAL HIGH (ref 4.0–10.5)
nRBC: 0 % (ref 0.0–0.2)

## 2020-02-12 LAB — GLUCOSE, CAPILLARY
Glucose-Capillary: 110 mg/dL — ABNORMAL HIGH (ref 70–99)
Glucose-Capillary: 115 mg/dL — ABNORMAL HIGH (ref 70–99)

## 2020-02-12 LAB — SURGICAL PATHOLOGY

## 2020-02-12 MED ORDER — LISINOPRIL 10 MG PO TABS
10.0000 mg | ORAL_TABLET | Freq: Every day | ORAL | Status: DC
  Administered 2020-02-12 – 2020-02-13 (×2): 10 mg via ORAL
  Filled 2020-02-12 (×2): qty 1

## 2020-02-12 NOTE — Progress Notes (Addendum)
The patient has been seen in conjunction with Micah Flesher, PAC. All aspects of care have been considered and discussed. The patient has been personally interviewed, examined, and all clinical data has been reviewed.   BP control is crucial. Currently on minimal therapy. Beta blocker and ARB preferred  Will need to follow closely.  Kidney function is okay.  Did well with Cardiac rehab walk today.   Progress Note  Patient Name: Aaron [name] Date of Encounter: 02/12/2020  [facility] Cardiologist: Verne Carrow, MD   Subjective   No chest pain, CT out, wires out today.   Inpatient Medications    Scheduled Meds: . aspirin EC  325 mg Oral Daily  . mouth rinse  15 mL Mouth Rinse BID  . melatonin  5 mg Oral QHS  . metoprolol tartrate  25 mg Oral BID  . pantoprazole  40 mg Oral QAC breakfast  . sodium chloride flush  3 mL Intravenous Q12H   Continuous Infusions: . sodium chloride     PRN Meds: sodium chloride, acetaminophen, ondansetron **OR** ondansetron (ZOFRAN) IV, oxyCODONE, senna-docusate, sodium chloride flush, traMADol   Vital Signs    Vitals:   02/11/20 2026 02/11/20 2339 02/12/20 0325 02/12/20 0800  BP:  (!) 114/55 (!) 147/68 124/61  Pulse:  82 77 76  Resp:  18 19 16   Temp: 99.5 F (37.5 C) 99.3 F (37.4 C) 98.7 F (37.1 C) 98.7 F (37.1 C)  TempSrc: Oral Oral Oral Oral  SpO2:  95% 99% 99%  Weight:      Height:        Intake/Output Summary (Last 24 hours) at 02/12/2020 1211 Last data filed at 02/12/2020 0800 Gross per 24 hour  Intake 120 ml  Output 1300 ml  Net -1180 ml   Last 3 Weights 02/11/2020 02/10/2020 02/08/2020  Weight (lbs) 280 lb 3.3 oz 277 lb 12.5 oz 295 lb  Weight (kg) 127.1 kg 126 kg 133.811 kg      Telemetry    Sinus rhythm in the 70s - Personally Reviewed  ECG    No new tracings - Personally Reviewed  Physical Exam   GEN: No acute distress.   Neck: No JVD Cardiac: RRR, no murmurs, rubs, or gallops.    Respiratory: Clear to auscultation bilaterally. GI: Soft, nontender, non-distended  MS: No edema; No deformity. Neuro:  Nonfocal  Psych: Normal affect   Right radial cath site C/D/I  Labs    High Sensitivity Troponin:   Recent Labs  Lab 02/04/20 1213 02/04/20 1530 02/08/20 1359  TROPONINIHS 5 4 4       Chemistry Recent Labs  Lab 02/10/20 0413 02/11/20 0047 02/12/20 0338  NA 137 135 137  K 3.6 3.6 3.9  CL 103 99 102  CO2 24 27 28   GLUCOSE 124* 110* 107*  BUN 9 6 9   CREATININE 1.28* 1.22 1.19  CALCIUM 8.4* 8.6* 8.7*  PROT 5.6*  --  5.9*  ALBUMIN 3.0*  --  2.7*  AST 64*  --  22  ALT 95*  --  43  ALKPHOS 35*  --  41  BILITOT 1.5*  --  1.2  GFRNONAA >60 >60 >60  GFRAA >60 >60 >60  ANIONGAP 10 9 7      Hematology Recent Labs  Lab 02/09/20 1631 02/09/20 1631 02/09/20 1633 02/10/20 0413 02/12/20 0338  WBC 12.6*  --   --  14.3* 11.3*  RBC 4.05*  --   --  3.97* 3.61*  HGB 12.7*   < >  11.9* 12.5* 11.4*  HCT 36.8*   < > 35.0* 36.4* 33.9*  MCV 90.9  --   --  91.7 93.9  MCH 31.4  --   --  31.5 31.6  MCHC 34.5  --   --  34.3 33.6  RDW 13.6  --   --  13.5 12.6  PLT 119*  --   --  127* 151   < > = values in this interval not displayed.    BNPNo results for input(s): BNP, PROBNP in the last 168 hours.   DDimer No results for input(s): DDIMER in the last 168 hours.   Radiology    DG Chest Port 1 View  Result Date: 02/12/2020 CLINICAL DATA:  Thoracic aortic dissection repair EXAM: PORTABLE CHEST 1 VIEW COMPARISON:  02/11/2020 FINDINGS: Stable postoperative changes. Persistent low lung volumes with basilar atelectasis, worse on the left. No enlarging effusion or pneumothorax. Heart is enlarged. Trachea midline. IMPRESSION: Similar low lung volumes and basilar atelectasis. Electronically Signed   By: Judie Petit.  Shick M.D.   On: 02/12/2020 08:12   DG CHEST PORT 1 VIEW  Result Date: 02/11/2020 CLINICAL DATA:  Repair of thoracic aortic dissection.  Follow-up. EXAM:  PORTABLE CHEST 1 VIEW COMPARISON:  February 10, 2020 FINDINGS: Skin staples remain over the lateral right mid chest. Stable cardiomegaly. The hila and mediastinum are unchanged. Trace residual pneumopericardium remains, unchanged. Mild atelectasis remains on the left. The lungs are otherwise clear. No other acute abnormalities. IMPRESSION: 1. Postoperative changes and support apparatus as above. 2. Low lung volumes with bibasilar subsegmental atelectasis. 3. Persistent cardiomegaly. Electronically Signed   By: Gerome Sam III M.D   On: 02/11/2020 08:32    Cardiac Studies   Left heart cath 02/08/20: Acute ascending aortic dissection Unable to selectively engage the coronary arteries but coronary artery patency demonstrated with non-selective angiography.   Recommendations: CT surgery consulted in the cath lab to review studies. Dr. Donata Clay present. Planning for emergent repair of his aortic dissection. Pt will be taken for urgent CTA chest/abdomen then to the OR.    Patient Profile     34 y.o. male with history of obesity, tobacco abuse, marijuana abuse presented to the ED 02/08/20 with c/o chest and abdominal pain with nausea. EKG with ST elevation in the lateral, inferior and anterolateral leads. He has been having on/of chest pain for the past 5 days. He was seen in the ED on 02/04/20 with similar complaints and his EKG was normal. Troponin was normal at that time x 2. He has continued to have chest and abdominal/epigastric pain since then. No relief with therapy for GERD.   Pt taken urgently to heart cath and found to have acute ascending aortic dissection. TCTS was consulted and he was taken for repair same day.  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 Recommendations for medications and follow up:  Discharge Medications: Continue medications as they are currently listed in the [facility].   Follow Up: The patient's Primary Cardiologist is Verne Carrow, MD  Follow up in the office in 1-2 weeks    For questions or updates, please contact [facility] Please consult [email or web address] for contact  info under        Signed, Marcelino Duster, PA  02/12/2020, 12:11 PM

## 2020-02-12 NOTE — Progress Notes (Signed)
Mobility Specialist: Progress Note    02/12/20 1735  Mobility  Activity Ambulated in hall  Level of Assistance Modified independent, requires aide device or extra time  Assistive Device Front wheel walker  Distance Ambulated (ft) 360 ft  Mobility Response Tolerated well  Mobility performed by Mobility specialist  Bed Position Chair  $Mobility charge 1 Mobility   Pre-Mobility: 80 HR, 150/65 BP, 96% SpO2 Post-Mobility: 77 HR, 138/67 BP, 96% SpO2  Pt tolerated ambulation well. Pt ambulates at slow pace,   Development worker, international aid

## 2020-02-12 NOTE — Progress Notes (Signed)
EPW removed per order. Tips intact. VSS. Pt tolerated well. Pt educated on bedrest for 1 hour. Call light in reach.  Versie Starks, RN

## 2020-02-12 NOTE — Progress Notes (Addendum)
      [address]       [place] [address]             [phone]      4 Days Post-Op Procedure(s) (LRB): REPAIR OF TYPE A THORACIC AORTIC DISSECTION USING A 32 MM STRAIGHT GRAFT; CIRC ARREST; AND RIGHT AXILLARY ARTERY CANNULATION. (N/A) TRANSESOPHAGEAL ECHOCARDIOGRAM (TEE) (N/A) Subjective: Transferred to 4E last evening.  York Spaniel he is continuing to progress. Appetite is returning, mobility is improving.  BM yesterday.   Objective: Vital signs in last 24 hours: Temp:  [98.7 F (37.1 C)-99.5 F (37.5 C)] 98.7 F (37.1 C) (08/23 0800) Pulse Rate:  [67-86] 76 (08/23 0800) Cardiac Rhythm: Normal sinus rhythm (08/23 0802) Resp:  [16-20] 16 (08/23 0800) BP: (114-147)/(53-72) 124/61 (08/23 0800) SpO2:  [93 %-99 %] 99 % (08/23 0800)   Intake/Output from previous day: 08/22 0701 - 08/23 0700 In: 120 [P.O.:120] Out: 1425 [Urine:1425] Intake/Output this shift: Total I/O In: -  Out: 250 [Urine:250]  General appearance: alert, cooperative and no distress Neurologic: intact Heart: regular rate and rhythm Lungs: Breath sounds are clear. Abdomen: soft, NT Extremities: All well perfused, no significant edema.  Wound: Aquacel dressings removed. The sternotomy and right infraclavicular incisions are intact and dry.   Lab Results: Recent Labs    02/10/20 0413 02/12/20 0338  WBC 14.3* 11.3*  HGB 12.5* 11.4*  HCT 36.4* 33.9*  PLT 127* 151   BMET:  Recent Labs    02/11/20 0047 02/12/20 0338  NA 135 137  K 3.6 3.9  CL 99 102  CO2 27 28  GLUCOSE 110* 107*  BUN 6 9  CREATININE 1.22 1.19  CALCIUM 8.6* 8.7*    PT/INR: No results for input(s): LABPROT, INR in the last 72 hours. ABG    Component Value Date/Time   PHART 7.396 02/09/2020 1633   HCO3 25.1 02/09/2020 1633   TCO2 26 02/09/2020 1633   O2SAT 96.0 02/09/2020 1633   CBG (last 3)  Recent Labs    02/11/20 1118 02/11/20 1536 02/12/20 0758  GLUCAP 104* 82 110*    Assessment/Plan: S/P  Procedure(s) (LRB): REPAIR OF TYPE A THORACIC AORTIC DISSECTION USING A 32 MM STRAIGHT GRAFT; CIRC ARREST; AND RIGHT AXILLARY ARTERY CANNULATION. (N/A) TRANSESOPHAGEAL ECHOCARDIOGRAM (TEE) (N/A)  -POD-4 repair of type A ascending aortic dissection. Progressing well. Mobility improving but will need to use several steps at home after discharge. Will work on that with PT today.  BP pretty well controlled on metoprolol 25mg  BID. Oxygenating well on RA and Wt. Is well below recorded pre-op Wt.   D/C pacer wires today.   -Stable renal function, creat trending down.   -Endo- no h/o diabetes mellitus. Glucose well controlled.   -Disposition-Anticipate discharge to home in 1-2 days    LOS: 3 days    , Leary Roca Cordelia Poche 02/12/2020   Chart reviewed, patient examined, agree with above. He feels well.  BP up to 140's at times. Started on low dose lisinopril today in addition to Metoprolol.This can be titrated up as needed. If he feels well in am he can go home tomorrow. I would keep staples in right axillary incision and remove in office next week.

## 2020-02-12 NOTE — Discharge Summary (Addendum)
Physician Discharge Summary  Patient ID: Aaron [name] MRN: [id number] DOB/AGE: [birth year] y.o.  Admit date: 02/08/2020 Discharge date: 02/13/2020  Admission Diagnoses:  Type A aortic Dissection Current smoker  Discharge Diagnoses:   Aortic dissection, thoracic (HCC) Current smoker Hypertension  Discharged Condition: stable  History of Present Illness:     I was called to see this patient in the cardiac Cath Lab by Dr. Clifton James.  The patient was being evaluated for possible STEMI with emergency cardiac catheterization.  The images of the aortic root were consistent with a type A aortic dissection.   The patient has had several days of chest pain associated with some back pain abdominal pain.  He was seen at a urgent care facility 4 days ago for these complaints.  He was felt to have GERD.  The symptoms have not improved and he presented to the ED again today with some EKG changes and positive troponin.  He was then sent to the cardiac Cath Lab. On the first injection, the aortic root was noted to be abnormal so the patient was sent directly from the cath lab to the radiology suite for CT scan of the chest. This confirmed a type A aortic dissection.    The patient has no family history of aortic dissection, hypertension or collagen vascular disease.  No history of family members with aortic aneurysm disease.  The patient smokes tobacco.  No history of cocaine use.  Hospital Course:   Emergency repair of the ascending aortic dissection was discussed with the patient by Dr. Maren Beach and [name] decided to proceed with surgery.  He was taken to the OR where emergent repair of the dissection was conducted using a straight graft and hypothermic circulatory arrest. Following the procedure he separated from cardiopulmonary bypass without difficult and he was transferred to the cardiovascular ICU. He remained hemodynamically stable. He was weaned from mechanical ventilator support per  protocol and extubated without difficulty. He was also weaned form all vasopressor support on the first post-op day.  He was mobilized early postoperatively and progressed to independent ambulation without difficulty. Diet was initiated and advanced and he had return of appropriate bowel and bladder function. He did not have any significant arrhythmias. He was transferred to [facility]E [facility] on post-op day 3. He was noted to have moderate hypertension with SBP 140-150's. He was treated with metoprolol and lisinopril. The importance of long-term blood pressure control was carefully discussed with [name].   His incisions appeared to be healing without any evidence of complication.  His pacer wires were removed on the 4th post-op day.He was felt to be stable for discharge on 02/13/20, POD-5.Marland Kitchen   Consults: cardiology  Significant Diagnostic Studies:   AORTIC ARCH ANGIOGRAPHY  LEFT HEART CATH AND CORONARY ANGIOGRAPHY  Conclusion  Acute ascending aortic dissection Unable to selectively engage the coronary arteries but coronary artery patency demonstrated with non-selective angiography.    Recommendations: CT surgery consulted in the cath lab to review studies. Dr. Donata Clay present. Planning for emergent repair of his aortic dissection. Pt will be taken for urgent CTA chest/abdomen then to the OR.  Recommendations  Antiplatelet/Anticoag CT surgery consulted in the cath lab to review studies. Dr. Donata Clay present. Planning for emergent repair of his aortic dissection. Pt will be taken for urgent CTA chest/abdomen then to the OR.  Surgeon Notes    02/10/2020  4:53 PM Operative Note filed by Kerin Perna, MD    02/09/2020  6:26  PM Brief Op Note signed by Kerin Perna, MD    02/09/2020  6:50 AM Operative Note - Scan signed by Default, Provider, MD  Indications  Chest pain of uncertain etiology [R07.9 (ICD-10-CM)]  Procedural Details  Technical Details Indication: 34 yo male with  history of tobacco abuse who presented via EMS with c/o chest pain, back pain, epigastric pain and nausea for 5 days, worsened chest pain and back pain today.   Procedure: The risks, benefits, complications, treatment options, and expected outcomes were discussed with the patient. Emergency consent obtained. The patient was sedated with Versed and Fentanyl. The right wrist was prepped and draped in a sterile fashion. 1% lidocaine was used for local anesthesia. Using the modified Seldinger access technique, a 5 French sheath was placed in the right radial artery. 3 mg Verapamil was given through the sheath. 3000 units IV heparin was given. Aortic root angiography performed with the pigtail catheter. This demonstrated a dilated aortic root with a translucency suggestive of an ascending aortic dissection. I was unable to selectively engage the coronary arteries but both the RCA and left coronary system are noted to be patent. LV pressures measured with the JR4 catheter. The sheath was not  removed from the right radial artery. The sheath was sewn into place.     Estimated blood loss <50 mL.   During this procedure medications were administered to achieve and maintain moderate conscious sedation while the patient's heart rate, blood pressure, and oxygen saturation were continuously monitored and I was present face-to-face 100% of this time.  Medications (Filter: Administrations occurring from 1456 to 1555 on 02/08/20)  (important)  Continuous medications are totaled by the amount administered until 02/08/20 1555.  midazolam (VERSED) injection (mg) Total dose:  2 mg  Date/Time  Rate/Dose/Volume Action  02/08/20 1503  2 mg Given    fentaNYL (SUBLIMAZE) injection (mcg) Total dose:  50 mcg  Date/Time  Rate/Dose/Volume Action  02/08/20 1503  50 mcg Given    lidocaine (PF) (XYLOCAINE) 1 % injection (mL) Total volume:  2 mL  Date/Time  Rate/Dose/Volume Action  02/08/20 1509  2 mL Given    Radial  Cocktail/Verapamil only (mL) Total volume:  10 mL  Date/Time  Rate/Dose/Volume Action  02/08/20 1509  10 mL Given    heparin sodium (porcine) injection (Units) Total dose:  3,000 Units  Date/Time  Rate/Dose/Volume Action  02/08/20 1512  3,000 Units Given    iohexol (OMNIPAQUE) 350 MG/ML injection (mL) Total volume:  90 mL  Date/Time  Rate/Dose/Volume Action  02/08/20 1548  90 mL Given    Sedation Time  Sedation Time Physician-1: 34 minutes 35 seconds  Contrast  Medication Name Total Dose  iohexol (OMNIPAQUE) 350 MG/ML injection 90 mL    Radiation/Fluoro  Fluoro time: 10.6 (min) DAP: 19114 (mGycm2) Cumulative Air Kerma: 250 (mGy)  Complications  Complications documented before study signed (02/08/2020  4:05 PM)     Log Level Complications  None Documented by Kathleene Hazel, MD 02/08/2020  4:04 PM  Date Found: 02/08/2020  Time Range: Intraprocedure    Coronary Findings  Diagnostic Dominance: Right No diagnostic findings have been documented. Intervention  No interventions have been documented. Coronary Diagrams  Diagnostic Dominance: Right       CT ANGIOGRAPHY CHEST, ABDOMEN AND PELVIS   TECHNIQUE: Non-contrast CT of the chest was initially obtained.   Multidetector CT imaging through the chest, abdomen and pelvis was performed using the standard protocol during bolus administration of intravenous contrast.  Multiplanar reconstructed images and MIPs were obtained and reviewed to evaluate the vascular anatomy.   CONTRAST:  90mL OMNIPAQUE IOHEXOL 350 MG/ML SOLN   COMPARISON:  January 01, 2009   FINDINGS: CTA CHEST FINDINGS   Cardiovascular: The ascending thoracic aorta is dilated and measures approximately 6.5 cm x 6.6 cm. Dissection of the ascending thoracic aorta is also seen. This extends through the aortic arch and into the descending thoracic aorta. Further progression into the abdomen is noted to the level just before the  bifurcation into the bilateral common iliac arteries. Involvement of a short portion of the proximal right brachiocephalic artery is seen (axial CT images 24 through 26, CT series number 7).   Satisfactory opacification of the pulmonary arteries to the segmental level. No evidence of pulmonary embolism. There is mild cardiomegaly. A trace amount of pericardial fluid is seen.   Mediastinum/Nodes: No enlarged mediastinal, hilar, or axillary lymph nodes. Thyroid gland, trachea, and esophagus demonstrate no significant findings.   Lungs/Pleura: Lungs are clear. No pleural effusion or pneumothorax.   Musculoskeletal: No chest wall abnormality. No acute or significant osseous findings.   Review of the MIP images confirms the above findings.   CTA ABDOMEN AND PELVIS FINDINGS   VASCULAR   Aorta: Normal caliber aorta with extensive dissection to the level proximal to the bilateral common iliac arteries. There is no evidence of abdominal aortic aneurysm, vasculitis or significant stenosis.   Celiac: Patent without evidence of aneurysm, dissection, vasculitis or significant stenosis.   SMA: Patent without evidence of aneurysm, dissection, vasculitis or significant stenosis.   Renals: Both renal arteries are patent without evidence of aneurysm, dissection, vasculitis, fibromuscular dysplasia or significant stenosis.   IMA: Patent without evidence of aneurysm, dissection, vasculitis or significant stenosis.   Inflow: Patent without evidence of aneurysm, dissection, vasculitis or significant stenosis.   Veins: No obvious venous abnormality within the limitations of this arterial phase study.   Review of the MIP images confirms the above findings.   NON-VASCULAR   Hepatobiliary: No focal liver abnormality is seen. No gallstones, gallbladder wall thickening, or biliary dilatation.   Pancreas: Unremarkable. No pancreatic ductal dilatation or surrounding inflammatory changes.     Spleen: Normal in size without focal abnormality.   Adrenals/Urinary Tract: Adrenal glands are unremarkable. Kidneys are normal, without renal calculi, focal lesion, or hydronephrosis. Bladder is unremarkable.   Stomach/Bowel: Stomach is within normal limits. Appendix appears normal. No evidence of bowel wall thickening, distention, or inflammatory changes.   Lymphatic: No abnormal abdominal or pelvic lymph nodes are seen.   Reproductive: Prostate is unremarkable.   Other: No abdominal wall hernia or abnormality. No abdominopelvic ascites.   Musculoskeletal: No acute or significant osseous findings.   Review of the MIP images confirms the above findings.   IMPRESSION: 1. 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 2. Aneurysmal dilatation of the ascending thoracic aorta, measuring approximately 6.5 cm x 6.6 cm. 3. Mild cardiomegaly with trace amount of pericardial fluid.     Electronically Signed   By: Aram Candela M.D.   On: 02/08/2020 19:51  Treatments:   OPERATIVE REPORT   DATE OF PROCEDURE:  02/08/2020   OPERATION: 1.  Emergency repair of type A ascending aortic dissection using a 32 mm Hemashield graft from the sinotubular junction to the proximal arch. 2.  Right axillary artery cannulation and antegrade cerebral perfusion during hypothermic circulatory arrest.   SURGEON:  Kerin Perna, MD   ASSISTANT:  Gershon Crane, PA-C.   ANESTHESIA:  General by Dr. Desmond Lope.   PREOPERATIVE DIAGNOSES:  Acute type A ascending aortic dissection.   POSTOPERATIVE DIAGNOSES:  Acute type A ascending aortic dissection.   CLINICAL NOTE:  The patient is a 34 year old male with several days of chest and abdominal and back pain, nausea and heartburn and burping.  He was seen at an urgent care facility and treated for GERD.  He returned with increasing symptoms and was found in the  ED to have positive cardiac enzymes with ST segment changes.  There was concern that he had a ST elevation myocardial infarction and underwent cardiac catheterization.  This was approached via the right radial artery.  The first catheter injection, however, showed the aorta was abnormal and was concerning for an aortic dissection.  The patient then went directly to CT scan without having coronary angiograms.  CT confirmed a type A aortic dissection, which extended the false lumen into the upper abdominal aorta.   I discussed the results of the catheterization and CT scan with the patient in the cath lab and on the way to the operating room from the CT scanner.  He understood that this was an emergency operation for acute life-threatening condition that needed  cardiac surgery, several hours of cardiopulmonary bypass with cooling of the body for circulatory arrest and had risks of bleeding, stroke, organ failure, MI and death.  He agreed to proceed with surgery.  Discharge Exam: Blood pressure 112/61, pulse 80, temperature 98.4 F (36.9 C), temperature source Oral, resp. rate 13, height 6\' 2"  (1.88 m), weight 124.8 kg, SpO2 97 %.  General appearance: alert, cooperative and no distress Neurologic: intact Heart: regular rate and rhythm Lungs: Breath sounds are clear. Abdomen: soft, NT Extremities: All well perfused, no significant edema.  Wound: Incisions are dry and well approximated.   Disposition:      Allergies as of 02/13/2020   No Known Allergies      Medication List     STOP taking these medications    OVER THE COUNTER MEDICATION       TAKE these medications    aspirin 325 MG EC tablet Take 1 tablet (325 mg total) by mouth daily. Start taking on: February 14, 2020   cetirizine 10 MG tablet Commonly known as: ZyrTEC Allergy Take 1 tablet (10 mg total) by mouth daily.   fluticasone 50 MCG/ACT nasal spray Commonly known as: FLONASE Place 1 spray into both nostrils  daily.   lisinopril 10 MG tablet Commonly known as: ZESTRIL Take 1 tablet (10 mg total) by mouth daily. Start taking on: February 14, 2020   metoprolol tartrate 25 MG tablet Commonly known as: LOPRESSOR Take 1 tablet (25 mg total) by mouth 2 (two) times daily.   omeprazole 20 MG capsule Commonly known as: PRILOSEC Take 1 capsule (20 mg total) by mouth 2 (two) times daily before a meal.   oxyCODONE 5 MG immediate release tablet Commonly known as: Oxy IR/ROXICODONE Take 1 tablet (5 mg total) by mouth every 4 (four) hours as needed for up to 7 days for severe pain.        Follow-up Information     February 16, 2020, PA-C. Go on 02/27/2020.   Specialty: Cardiology Why: Your cadiology follow up appointment is at 11:15am Contact information: [address] [place] [phone]         [facility]  [place]. Go on 02/19/2020.   Specialty: Cardiothoracic Surgery Why: Your appointment for staple removal is on Monday, 02/19/20 at 11:00am. Contact information: [address], [address] [place] [id number] [phone]        Kerin PernaVan Trigt, Shaylon Gillean, MD. Go on 03/06/2020.   Specialty: Cardiothoracic Surgery Why: Your follow up appointment with Dr. Maren BeachVanTrigt is on Wednesday, 03/06/20 at 2:30pm. Contact information: [address] [place] [address] [phone]                 Signed: Leary RocaMyron G. Roddenberry, PA-C 02/13/2020, 8:48 AM  patient examined and medical record reviewed,agree with above note. Kathlee Nationseter Van Trigt III 02/16/2020

## 2020-02-12 NOTE — Progress Notes (Signed)
CARDIAC REHAB PHASE I   PRE:  Rate/Rhythm: 77 SR  BP:  Supine:   Sitting: 133/60  Standing:    SaO2: 97%RA  MODE:  Ambulation: 470 ft   POST:  Rate/Rhythm: 88 SR  BP:  Supine:   Sitting: 132/70  Standing:    SaO2: 97%RA 1044-1130 Pt walked 470 ft on RA with EVA and tolerated well. Back to recliner after walk. Encouraged walks with [facility] staff. Pt appreciative of walk.    Luetta Nutting, RN BSN  02/12/2020 11:26 AM

## 2020-02-13 LAB — BASIC METABOLIC PANEL
Anion gap: 9 (ref 5–15)
BUN: 11 mg/dL (ref 6–20)
CO2: 27 mmol/L (ref 22–32)
Calcium: 8.7 mg/dL — ABNORMAL LOW (ref 8.9–10.3)
Chloride: 101 mmol/L (ref 98–111)
Creatinine, Ser: 1.25 mg/dL — ABNORMAL HIGH (ref 0.61–1.24)
GFR calc Af Amer: >60 mL/min (ref >60)
GFR calc non Af Amer: >60 mL/min (ref >60)
Glucose, Bld: 104 mg/dL — ABNORMAL HIGH (ref 70–99)
Potassium: 3.5 mmol/L (ref 3.5–5.1)
Sodium: 137 mmol/L (ref 135–145)

## 2020-02-13 LAB — CBC
HCT: 32.5 % — ABNORMAL LOW (ref 39.0–52.0)
Hemoglobin: 11 g/dL — ABNORMAL LOW (ref 13.0–17.0)
MCH: 31.7 pg (ref 26.0–34.0)
MCHC: 33.8 g/dL (ref 30.0–36.0)
MCV: 93.7 fL (ref 80.0–100.0)
Platelets: 172 10*3/uL (ref 150–400)
RBC: 3.47 MIL/uL — ABNORMAL LOW (ref 4.22–5.81)
RDW: 12.5 % (ref 11.5–15.5)
WBC: 8.7 10*3/uL (ref 4.0–10.5)
nRBC: 0 % (ref 0.0–0.2)

## 2020-02-13 MED ORDER — ASPIRIN 325 MG PO TBEC
325.0000 mg | DELAYED_RELEASE_TABLET | Freq: Every day | ORAL | 0 refills | Status: DC
Start: 2020-02-14 — End: 2020-11-08

## 2020-02-13 MED ORDER — METOPROLOL TARTRATE 25 MG PO TABS
25.0000 mg | ORAL_TABLET | Freq: Two times a day (BID) | ORAL | 2 refills | Status: DC
Start: 2020-02-13 — End: 2020-03-18

## 2020-02-13 MED ORDER — OXYCODONE HCL 5 MG PO TABS
5.0000 mg | ORAL_TABLET | ORAL | 0 refills | Status: AC | PRN
Start: 2020-02-13 — End: 2020-02-20

## 2020-02-13 MED ORDER — LISINOPRIL 10 MG PO TABS
10.0000 mg | ORAL_TABLET | Freq: Every day | ORAL | 2 refills | Status: DC
Start: 2020-02-14 — End: 2020-03-18

## 2020-02-13 MED ORDER — POTASSIUM CHLORIDE CRYS ER 20 MEQ PO TBCR
40.0000 meq | EXTENDED_RELEASE_TABLET | Freq: Once | ORAL | Status: AC
  Administered 2020-02-13: 40 meq via ORAL
  Filled 2020-02-13: qty 2

## 2020-02-13 NOTE — Discharge Instructions (Signed)
Aortic Dissection  Aortic dissection happens when there is a tear in the wall of the body's main blood vessel (aorta). The aorta leads out of the heart (ascending aorta), curves around, and then goes down the chest (descending aorta) and into the abdomen to supply arteries with blood. The wall of the aorta has inner and outer layers. As blood collects along the tear, one part of the aorta continues to carry blood to the body, but blood can also flow into the tear between the layers of the aorta. The torn part of the aorta fills with blood and swells. This can reduce blood flow through the part of the aorta that is still supplying blood to the body. Aortic dissection is a medical emergency. What are the causes? This condition is commonly caused by weakening of the artery wall due to high blood pressure. Other causes may include:  An injury, such as from a car crash.  A complication from heart surgery or from a diagnostic procedure called coronary catheterization.  Weakness of the artery wall due to birth defects that affect the connective tissues, such as Marfan syndrome. In some cases, the cause is not known. What increases the risk? The following factors may make you more likely to develop this condition:  Having certain medical conditions, such as: ? High blood pressure (hypertension). ? Hardening and narrowing of the arteries (atherosclerosis). ? A condition that causes inflammation of blood vessels, such as giant cell arteritis.  Having two cusps in the aortic valve instead of three (bicuspid aortic valve).  Having a bulge in the wall of the aorta (aortic aneurysm).  Being male.  Being pregnant.  Being older than age 26.  Using cocaine.  Smoking.  Lifting heavy weights or doing other types of strength training (high-intensity resistance training). What are the signs or symptoms? Signs and symptoms of aortic dissection start suddenly. The most common symptoms  are:  Severe chest pain that may feel like tearing, stabbing, or sharp pain.  Severe pain that spreads (radiates) to the back, neck, jaw, or abdomen.  Severe pain between the shoulder blades in the back. Other symptoms may include:  Trouble breathing.  Dizziness or fainting.  Sudden weakness on one side of the body.  Nausea or vomiting.  Trouble swallowing.  Coughing up blood.  Vomiting blood.  Clammy skin. How is this diagnosed? This condition may be diagnosed based on:  Your symptoms and a physical exam. This may include: ? Listening for abnormal blood flow sounds (murmurs) in your chest or abdomen. ? Checking your pulse in your arms and legs. ? Checking your blood pressure to see whether it is low, or whether there is a difference between the measurements (readings) from your right arm and left arm.  Electrocardiogram (ECG). This test measures the electrical activity in your heart.  Chest X-ray.  CT scan.  MRI.  Echocardiogram. This uses sound waves to make images of your heart.  Blood tests. How is this treated? It is important to treat aortic dissection as quickly as possible. Treatment may start as soon as your health care provider thinks that you have aortic dissection. Treatment depends on where the dissection is, how severe it is, and your overall health. Treatment may include:  Medicines to lower your heart rate and blood pressure.  Surgery to repair your aorta using artificial material (syntheticgraft).  A procedure to insert a stent-graft into the aorta (endovascular procedure). During this procedure: 1. A long, thin tube (stent) is inserted  into an artery near the groin (femoral artery). 2. The stent is moved up to the damaged part of the aorta. 3. The stent is opened to help improve blood flow and prevent future dissection. Your health care provider may refer you to a specialized treatment center. Follow these instructions at home: If you had  surgery, follow instructions from your health care provider about home care after the procedure. Activity  Do not lift anything that is heavier than 10 lb (4.5 kg), or the limit that you are told, until your health care provider says that it is safe.  Avoid activities that could injure your chest or abdomen. Ask your health care provider what activities are safe for you.  After you have recovered, try to stay active. Ask your health care provider what activities are safe for you after recovery.  Enroll in cardiac rehabilitation. This is a program that helps to improve your health and well-being. It includes exercise training, education, and counseling to help you recover. Lifestyle      Eat a heart-healthy diet, which includes lots of fresh fruits and vegetables, low-fat (lean) protein, and whole grains.  Work with your health care provider to treat any other conditions that you may have, such as obesity, high blood pressure, or diabetes.  Do not use any products that contain nicotine or tobacco, such as cigarettes, e-cigarettes, and chewing tobacco. If you need help quitting, ask your health care provider. General instructions  Take over-the-counter and prescription medicines only as told by your health care provider.  Talk with your health care provider about how to manage stress.  Keep all follow-up visits as told by your health care provider. This is important. Get help right away if you:  Develop any symptoms of aortic dissection after treatment, including severe pain in your chest, back, or abdomen.  Have pain in your chest.  Have weakness in your arm or leg.  Have pain in your abdomen.  Have trouble breathing or you develop a cough.  Faint.  Develop a racing heartbeat (palpitations). These symptoms may represent a serious problem that is an emergency. Do not wait to see if the symptoms will go away. Get medical help right away. Call your local emergency services (911  in the [place] Do not drive yourself to the hospital. Summary  Aortic dissection happens when there is a tear in the wall of the body's main blood vessel (aorta). It is a medical emergency.  The most common symptom is severe pain in the chest or pain that spreads (radiates) to the back, neck, jaw, or abdomen.  It is important to treat aortic dissection as quickly as possible. Treatment usually includes medicines and surgery.  Take over-the-counter and prescription medicines only as told by your health care provider. This information is not intended to replace advice given to you by your health care provider. Make sure you discuss any questions you have with your health care provider. Document Revised: 12/15/2017 Document Reviewed: 11/18/2017 Elsevier Patient Education  2020 [employer]. Discharge Instructions:  1. You may shower, please wash incisions daily with soap and water and keep dry.  If you wish to cover wounds with dressing you may do so but please keep clean and change daily.  No tub baths or swimming until incisions have completely healed.  If your incisions become red or develop any drainage please call our office at [phone]  2. No Driving until cleared by Dr. Lorrin Mais office and you are no longer using narcotic  pain medications  3. Monitor your weight daily.. Please use the same scale and weigh at same time... If you gain 5-10 lbs in 48 hours with associated lower extremity swelling, please contact our office at [phone]  4. Fever of 101.5 for at least 24 hours with no source, please contact our office at [phone]  5. Activity- up as tolerated, please walk at least 3 times per day.  Avoid strenuous activity, no lifting, pushing, or pulling with your arms over 8-10 lbs for a minimum of 6 weeks  6. If any questions or concerns arise, please do not hesitate to contact our office at [phone]'

## 2020-02-13 NOTE — Progress Notes (Addendum)
CARDIAC REHAB PHASE I   PRE:  Rate/Rhythm: 72 SR  BP:  Supine:   Sitting: 112/61  Standing:    SaO2: 98%RA   MODE:  Ambulation: 200 ft   POST:  Rate/Rhythm: 70 SR 1130-1155 Since pt had not been up without rolling walker, walked with pt short distance. Tolerated well with hand held asst. Lower back hurting him but sternal pain minimal. Reviewed sternal precautions, IS, wound care, walking for exercise and gave heart healthy diet. Gave smoking cessation handout and encouraged cessation.  Pt stated girlfriend would be able to assist with care. Voiced understanding of ed.        Luetta Nutting, RN BSN  02/13/2020 11:52 AM

## 2020-02-13 NOTE — Progress Notes (Signed)
      [address]       [place] [address]             [phone]      5 Days Post-Op Procedure(s) (LRB): REPAIR OF TYPE A THORACIC AORTIC DISSECTION USING A 32 MM STRAIGHT GRAFT; CIRC ARREST; AND RIGHT AXILLARY ARTERY CANNULATION. (N/A) TRANSESOPHAGEAL ECHOCARDIOGRAM (TEE) (N/A) Subjective: No new concerns.  Says he feels he is ready to return home today.  Walked several times yesterday, BM yesterday.   Objective: Vital signs in last 24 hours: Temp:  [98.4 F (36.9 C)-99.8 F (37.7 C)] 98.4 F (36.9 C) (08/24 0809) Pulse Rate:  [74-90] 80 (08/24 0437) Cardiac Rhythm: Normal sinus rhythm (08/23 1912) Resp:  [13-20] 13 (08/24 0809) BP: (98-154)/(44-65) 112/61 (08/24 0809) SpO2:  [97 %-99 %] 97 % (08/24 0809) Weight:  [124.8 kg] 124.8 kg (08/24 0445)    Intake/Output from previous day: 08/23 0701 - 08/24 0700 In: 240 [P.O.:240] Out: 250 [Urine:250] Intake/Output this shift: Total I/O In: 360 [P.O.:360] Out: 300 [Urine:300]  General appearance: alert, cooperative and no distress Neurologic: intact Heart: regular rate and rhythm Lungs: Breath sounds are clear. Abdomen: soft, NT Extremities: All well perfused, no significant edema.  Wound: Incisions are dry and well approximated.   Lab Results: Recent Labs    02/12/20 0338 02/13/20 0402  WBC 11.3* 8.7  HGB 11.4* 11.0*  HCT 33.9* 32.5*  PLT 151 172   BMET:  Recent Labs    02/12/20 0338 02/13/20 0402  NA 137 137  K 3.9 3.5  CL 102 101  CO2 28 27  GLUCOSE 107* 104*  BUN 9 11  CREATININE 1.19 1.25*  CALCIUM 8.7* 8.7*    PT/INR: No results for input(s): LABPROT, INR in the last 72 hours. ABG    Component Value Date/Time   PHART 7.396 02/09/2020 1633   HCO3 25.1 02/09/2020 1633   TCO2 26 02/09/2020 1633   ACIDBASEDEF 4.0 (H) 02/09/2020 0040   O2SAT 96.0 02/09/2020 1633   CBG (last 3)  Recent Labs    02/11/20 1536 02/12/20 0758 02/12/20 1234  GLUCAP 82 110* 115*     Assessment/Plan: S/P Procedure(s) (LRB): REPAIR OF TYPE A THORACIC AORTIC DISSECTION USING A 32 MM STRAIGHT GRAFT; CIRC ARREST; AND RIGHT AXILLARY ARTERY CANNULATION. (N/A) TRANSESOPHAGEAL ECHOCARDIOGRAM (TEE) (N/A)  -POD-5 repair of type A ascending aortic dissection. Progressing well.  Had moderate HTN yesterday afternoon, lisinopril added.   Oxygenating well on RA and Wt. Is well below recorded pre-op Wt Plan discharge today. Will f/u in 1 week for staple removal and on 9/15 with Dr. Maren Beach.  The importance of carefull BP control discussed with Mr. Thomure. Cardiology follow up also arranged.    -Stable renal function, creat trending down.   -Endo- no h/o diabetes mellitus. Glucose well controlled.    LOS: 4 days    Leary Roca, [place] [phone] 02/13/2020

## 2020-02-13 NOTE — Progress Notes (Signed)
Progress Note  Patient Name: Aaron [name] Date of Encounter: 02/13/2020  [facility] Cardiologist: Verne Carrow, MD   Subjective   Pt feels well, no complaints  Inpatient Medications    Scheduled Meds: . aspirin EC  325 mg Oral Daily  . lisinopril  10 mg Oral Daily  . mouth rinse  15 mL Mouth Rinse BID  . melatonin  5 mg Oral QHS  . metoprolol tartrate  25 mg Oral BID  . pantoprazole  40 mg Oral QAC breakfast  . sodium chloride flush  3 mL Intravenous Q12H   Continuous Infusions: . sodium chloride Stopped (02/13/20 0300)   PRN Meds: sodium chloride, acetaminophen, ondansetron **OR** ondansetron (ZOFRAN) IV, oxyCODONE, senna-docusate, sodium chloride flush, traMADol   Vital Signs    Vitals:   02/12/20 2359 02/13/20 0437 02/13/20 0445 02/13/20 0809  BP: (!) 98/44 (!) 135/54  112/61  Pulse: 90 80    Resp: 20 18  13   Temp: 98.8 F (37.1 C) 98.6 F (37 C)  98.4 F (36.9 C)  TempSrc: Oral Oral  Oral  SpO2: 97% 97%  97%  Weight:   124.8 kg   Height:        Intake/Output Summary (Last 24 hours) at 02/13/2020 1222 Last data filed at 02/13/2020 0820 Gross per 24 hour  Intake 480 ml  Output 300 ml  Net 180 ml   Last 3 Weights 02/13/2020 02/11/2020 02/10/2020  Weight (lbs) 275 lb 2.2 oz 280 lb 3.3 oz 277 lb 12.5 oz  Weight (kg) 124.8 kg 127.1 kg 126 kg      Telemetry    Sinus in the 60-70s - Personally Reviewed  ECG    No new tracings - Personally Reviewed  Physical Exam   GEN: No acute distress.   Neck: No JVD Cardiac: RRR, sternotomy C/D/I Neuro:  Nonfocal  Psych: Normal affect   Labs    High Sensitivity Troponin:   Recent Labs  Lab 02/04/20 1213 02/04/20 1530 02/08/20 1359  TROPONINIHS 5 4 4       Chemistry Recent Labs  Lab 02/10/20 0413 02/10/20 0413 02/11/20 0047 02/12/20 0338 02/13/20 0402  NA 137   < > 135 137 137  K 3.6   < > 3.6 3.9 3.5  CL 103   < > 99 102 101  CO2 24   < > 27 28 27   GLUCOSE 124*   < > 110*  107* 104*  BUN 9   < > 6 9 11   CREATININE 1.28*   < > 1.22 1.19 1.25*  CALCIUM 8.4*   < > 8.6* 8.7* 8.7*  PROT 5.6*  --   --  5.9*  --   ALBUMIN 3.0*  --   --  2.7*  --   AST 64*  --   --  22  --   ALT 95*  --   --  43  --   ALKPHOS 35*  --   --  41  --   BILITOT 1.5*  --   --  1.2  --   GFRNONAA >60   < > >60 >60 >60  GFRAA >60   < > >60 >60 >60  ANIONGAP 10   < > 9 7 9    < > = values in this interval not displayed.     Hematology Recent Labs  Lab 02/10/20 0413 02/12/20 0338 02/13/20 0402  WBC 14.3* 11.3* 8.7  RBC 3.97* 3.61* 3.47*  HGB 12.5* 11.4* 11.0*  HCT 36.4* 33.9* 32.5*  MCV 91.7 93.9 93.7  MCH 31.5 31.6 31.7  MCHC 34.3 33.6 33.8  RDW 13.5 12.6 12.5  PLT 127* 151 172    BNPNo results for input(s): BNP, PROBNP in the last 168 hours.   DDimer No results for input(s): DDIMER in the last 168 hours.   Radiology    DG Chest Port 1 View  Result Date: 02/12/2020 CLINICAL DATA:  Thoracic aortic dissection repair EXAM: PORTABLE CHEST 1 VIEW COMPARISON:  02/11/2020 FINDINGS: Stable postoperative changes. Persistent low lung volumes with basilar atelectasis, worse on the left. No enlarging effusion or pneumothorax. Heart is enlarged. Trachea midline. IMPRESSION: Similar low lung volumes and basilar atelectasis. Electronically Signed   By: Judie Petit.  Shick M.D.   On: 02/12/2020 08:12    Cardiac Studies   Left heart cath 02/08/20: Acute ascending aortic dissection Unable to selectively engage the coronary arteries but coronary artery patency demonstrated with non-selective angiography.   Recommendations: CT surgery consulted in the cath lab to review studies. Dr. Donata Clay present. Planning for emergent repair of his aortic dissection. Pt will be taken for urgent CTA chest/abdomen then to the OR.  Patient Profile     34 y.o. male with history of obesity, tobacco abuse, marijuana abuse presented to the ED 02/08/20 with c/o chest and abdominal pain with nausea. EKG with ST  elevation in the lateral, inferior and anterolateral leads. He has been having on/of chest pain for the past 5 days. He was seen in the ED on 02/04/20 with similar complaints and his EKG was normal. Troponin was normal at that time x 2. He has continued to have chest and abdominal/epigastric pain since then. No relief with therapy for GERD.  Pt taken urgently to heart cath and found to have acute ascending aortic dissection. TCTS was consulted and he was taken for repair same day.  Assessment & Plan    Type A ascending aortic dissection s/p repair HTN - pt progressing well - BP elevated yesterday - added 10 mg lisinopril - pressure looks much better today - sCr 1.25 (1.19), K 3.5 - continue BB - 325 mg ASA per TCTS   Current smoker Encouraged cessation   Cardiology follow up has been arranged.   For questions or updates, please contact [facility] Please consult [email or web address] for contact info under        Signed, Marcelino Duster, PA  02/13/2020, 12:22 PM

## 2020-02-14 MED FILL — Sodium Chloride IV Soln 0.9%: INTRAVENOUS | Qty: 4000 | Status: AC

## 2020-02-14 MED FILL — Lidocaine HCl Local Soln Prefilled Syringe 100 MG/5ML (2%): INTRAMUSCULAR | Qty: 5 | Status: AC

## 2020-02-14 MED FILL — Mannitol IV Soln 20%: INTRAVENOUS | Qty: 500 | Status: AC

## 2020-02-14 MED FILL — Sodium Bicarbonate IV Soln 8.4%: INTRAVENOUS | Qty: 50 | Status: AC

## 2020-02-14 MED FILL — Lidocaine HCl Local Preservative Free (PF) Inj 2%: INTRAMUSCULAR | Qty: 15 | Status: AC

## 2020-02-14 MED FILL — Electrolyte-R (PH 7.4) Solution: INTRAVENOUS | Qty: 8000 | Status: AC

## 2020-02-14 MED FILL — Heparin Sodium (Porcine) Inj 1000 Unit/ML: INTRAMUSCULAR | Qty: 10 | Status: AC

## 2020-02-14 MED FILL — Heparin Sodium (Porcine) Inj 1000 Unit/ML: INTRAMUSCULAR | Qty: 30 | Status: AC

## 2020-02-14 MED FILL — Potassium Chloride Inj 2 mEq/ML: INTRAVENOUS | Qty: 40 | Status: AC

## 2020-02-19 ENCOUNTER — Ambulatory Visit (INDEPENDENT_AMBULATORY_CARE_PROVIDER_SITE_OTHER): Payer: Self-pay

## 2020-02-19 ENCOUNTER — Other Ambulatory Visit: Payer: Self-pay | Admitting: Cardiothoracic Surgery

## 2020-02-19 ENCOUNTER — Telehealth: Payer: Self-pay

## 2020-02-19 ENCOUNTER — Other Ambulatory Visit: Payer: Self-pay

## 2020-02-19 DIAGNOSIS — Z4802 Encounter for removal of sutures: Secondary | ICD-10-CM

## 2020-02-19 MED ORDER — TRAMADOL HCL 50 MG PO TABS
50.0000 mg | ORAL_TABLET | Freq: Four times a day (QID) | ORAL | 0 refills | Status: DC | PRN
Start: 2020-02-19 — End: 2020-05-22

## 2020-02-19 NOTE — Telephone Encounter (Signed)
Attempted to contact patient back in regards to medication refill per Dr. Donata Clay.  After multiple attempts unable to reach patient, and unable to leave voicemail message.  Will await return call if warranted.

## 2020-02-19 NOTE — Progress Notes (Signed)
Patient arrived for nurse visit to remove suture/staples post- procedure Repair thoracic aortic dissection 02/08/20 with Dr. Donata Clay. 3 Sutures/ 9 staples removed with no signs/ symptoms of infection noted.  All incisions were well approximated and healing nicely.  Patient tolerated procedure well.  Patient/ family instructed to keep the incision sites clean and dry.  Patient/ family acknowledged instructions given.  Patient requested a couple betadine swabs to apply at home.  Gave enough for once a day for a week and advised he did not need to do it anymore afterwards.  Patient requested refill of pain medication, Oxycodone.  States that he is taking it every 4-5 hrs as prescribed and is helping with his pain.  Will check with Dr. Donata Clay for medication refill.  Also, patient stated that he was having some trouble sleeping at night.  I advised that patient could take OTC benadryl 25 mg or up to 10 mg of Melatonin at night before be to help aid in sleep.   Patient's wife requested use of laxative for bowel movements since he is using pain medications.  Advised that he could, and also start with OTC Colace to help aid in bowel movements.  She acknowledged receipt.  Patient and his wife are aware of his follow-up appointments with Cardiology and our office before they left the visit today.

## 2020-02-19 NOTE — Telephone Encounter (Signed)
-----   Message from Kerin Perna, MD sent at 02/19/2020  3:24 PM EDT ----- Regarding: RE: Pain medication refill Thanks- I will refill oxycodone- ONCE  PVT ----- Message ----- From: Steve Rattler, RN Sent: 02/19/2020  11:49 AM EDT To: Kerin Perna, MD Subject: Pain medication refill                         Hey,  Patient was in the office for suture/ staple removal appointment.  Incisions look well.  He was requesting a refill of Oxy.  Stated that he is taking them about every 4-5 hours and is helping with pain.  Advised that he may get a refill, possibly Tramadol.  Please advise.  Thanks,  Morrie Sheldon

## 2020-02-21 NOTE — Progress Notes (Deleted)
Cardiology Office Note    Date:  02/21/2020   ID:  Aaron [name], DOB [birth year], MRN [id number]  PCP:  Patient, No Pcp Per  Cardiologist: Verne Carrow, MD EPS: None  No chief complaint on file.   History of Present Illness:  Aaron [name] is a 34 y.o. male who was admitted with several days of chest pain and abdominal pain with EKG changes and positive troponins he was sent to the Cath Lab and on first injection aortic root was noted to be abnormal so the patient was sent directly to CT scan which confirmed type a aortic dissection.  Patient has no family history of aortic dissection hypertension or collagen vascular disease.  He does smoke.  Of cocaine use.  Patient underwent emergency repair of ascending aortic dissection by Dr. Maren Beach.  Hypertension postop treated with metoprolol and lisinopril with the importance of long-term blood pressure control discussed with patient.    Past Medical History:  Diagnosis Date  . Tobacco abuse     Past Surgical History:  Procedure Laterality Date  . AORTIC ARCH ANGIOGRAPHY N/A 02/08/2020   Procedure: AORTIC ARCH ANGIOGRAPHY;  Surgeon: Kathleene Hazel, MD;  [facility];  Service: Cardiovascular;  Laterality: N/A;  Aortic Root  . FRACTURE SURGERY    . HAND SURGERY    . HERNIA REPAIR    . LEFT HEART CATH AND CORONARY ANGIOGRAPHY N/A 02/08/2020   Procedure: LEFT HEART CATH AND CORONARY ANGIOGRAPHY;  Surgeon: Kathleene Hazel, MD;  Location: [facility];  Service: Cardiovascular;  Laterality: N/A;  . REPAIR OF ACUTE ASCENDING THORACIC AORTIC DISSECTION N/A 02/08/2020   Procedure: REPAIR OF TYPE A THORACIC AORTIC DISSECTION USING A 32 MM STRAIGHT GRAFT; CIRC ARREST; AND RIGHT AXILLARY ARTERY CANNULATION.;  Surgeon: Kerin Perna, MD;  Location[facility];  Service: Vascular;  Laterality: N/A;  . TEE WITHOUT CARDIOVERSION N/A 02/08/2020   Procedure: TRANSESOPHAGEAL ECHOCARDIOGRAM (TEE);   Surgeon: Donata Clay, Theron Arista, MD;  Location: [facility] OR;  Service: Open Heart Surgery;  Laterality: N/A;    Current Medications: No outpatient medications have been marked as taking for the 02/27/20 encounter (Appointment) with Dyann Kief, PA-C.     Allergies:   Patient has no known allergies.   Social History   Socioeconomic History  . Marital status: Single    Spouse name: Not on file  . Number of children: Not on file  . Years of education: Not on file  . Highest education level: Not on file  Occupational History  . Not on file  Tobacco Use  . Smoking status: Current Every Day Smoker  . Smokeless tobacco: Never Used  Vaping Use  . Vaping Use: Never used  Substance and Sexual Activity  . Alcohol use: No  . Drug use: Yes    Types: Marijuana  . Sexual activity: Not on file  Other Topics Concern  . Not on file  Social History Narrative  . Not on file   Social Determinants of Health   Financial Resource Strain:   . Difficulty of Paying Living Expenses: Not on file  Food Insecurity:   . Worried About Programme researcher, broadcasting/film/video in the Last Year: Not on file  . Ran Out of Food in the Last Year: Not on file  Transportation Needs:   . Lack of Transportation (Medical): Not on file  . Lack of Transportation (Non-Medical): Not on file  Physical Activity:   . Days of Exercise per  Week: Not on file  . Minutes of Exercise per Session: Not on file  Stress:   . Feeling of Stress : Not on file  Social Connections:   . Frequency of Communication with Friends and Family: Not on file  . Frequency of Social Gatherings with Friends and Family: Not on file  . Attends Religious Services: Not on file  . Active Member of Clubs or Organizations: Not on file  . Attends Banker Meetings: Not on file  . Marital Status: Not on file     Family History:  The patient's ***family history includes Diabetes in his mother.   ROS:   Please see the history of present illness.    ROS All  other systems reviewed and are negative.   PHYSICAL EXAM:   VS:  There were no vitals taken for this visit.  Physical Exam  GEN: Well nourished, well developed, in no acute distress  HEENT: normal  Neck: no JVD, carotid bruits, or masses Cardiac:RRR; no murmurs, rubs, or gallops  Respiratory:  clear to auscultation bilaterally, normal work of breathing GI: soft, nontender, nondistended, + BS Ext: without cyanosis, clubbing, or edema, Good distal pulses bilaterally MS: no deformity or atrophy  Skin: warm and dry, no rash Neuro:  Alert and Oriented x 3, Strength and sensation are intact Psych: euthymic mood, full affect  Wt Readings from Last 3 Encounters:  02/13/20 275 lb 2.2 oz (124.8 kg)  10/04/18 290 lb (131.5 kg)  06/13/18 291 lb 3 oz (132.1 kg)      Studies/Labs Reviewed:   EKG:  EKG is*** ordered today.  The ekg ordered today demonstrates ***  Recent Labs: 02/09/2020: Magnesium 2.4 02/12/2020: ALT 43 02/13/2020: BUN 11; Creatinine, Ser 1.25; Hemoglobin 11.0; Platelets 172; Potassium 3.5; Sodium 137   Lipid Panel No results found for: CHOL, TRIG, HDL, CHOLHDL, VLDL, LDLCALC, LDLDIRECT  Additional studies/ records that were reviewed today include:  Left heart cath 02/08/20: Acute ascending aortic dissection Unable to selectively engage the coronary arteries but coronary artery patency demonstrated with non-selective angiography.    Recommendations: CT surgery consulted in the cath lab to review studies. Dr. Donata Clay present. Planning for emergent repair of his aortic dissection. Pt will be taken for urgent CTA chest/abdomen then to the OR.      Patient Profile            ASSESSMENT:    No diagnosis found.   PLAN:  In order of problems listed above:  Type A ascending aortic dissection s/p repair - pt progressing well - has been started on lopressor 25 mg BID and lisinopril - 325 mg ASA per CTS     Current smoker - encouraged cessation      HTN   Medication Adjustments/Labs and Tests Ordered: Current medicines are reviewed at length with the patient today.  Concerns regarding medicines are outlined above.  Medication changes, Labs and Tests ordered today are listed in the Patient Instructions below. There are no Patient Instructions on file for this visit.   Elson Clan, PA-C  02/21/2020 3:47 PM    [facility] [address] [place], [place], [place]  [address] Phone: [phone]; Fax: [phone]

## 2020-02-27 ENCOUNTER — Ambulatory Visit: Payer: Self-pay | Admitting: Physician Assistant

## 2020-03-05 ENCOUNTER — Encounter (HOSPITAL_COMMUNITY): Payer: Self-pay | Admitting: Emergency Medicine

## 2020-03-05 ENCOUNTER — Other Ambulatory Visit: Payer: Self-pay

## 2020-03-05 ENCOUNTER — Ambulatory Visit (HOSPITAL_COMMUNITY)
Admission: EM | Admit: 2020-03-05 | Discharge: 2020-03-05 | Disposition: A | Discharge status: Home / Self Care | Payer: Self-pay | Attending: Family Medicine | Admitting: Family Medicine

## 2020-03-05 DIAGNOSIS — R0602 Shortness of breath: Secondary | ICD-10-CM

## 2020-03-05 DIAGNOSIS — R002 Palpitations: Secondary | ICD-10-CM

## 2020-03-05 NOTE — ED Provider Notes (Signed)
[facility]    CSN: [id number] Arrival date & time: 03/05/20  1410      History   Chief Complaint No chief complaint on file. Shortness of Breath  HPI Aaron [name] is a 34 y.o. male.   He presents to the clinic today for chest discomfort.  Notable past medical history of type a aortic dissection repair and hospital management discharged on 02/12/2020.  Patient states that today he exerted himself a little bit more than normal by walking to and from the store.  He stated he felt shortness of breath, heart racing, fatigue, and increase in sweating.  Patient denies any nausea, vomiting, chest pain, or difficulty breathing.  He said he was concerned given his recent past medical history and wanted to come in to be checked out.  He is currently asymptomatic in clinic and is sitting comfortably.  Vital signs are stable.  He is scheduled to meet with his cardiologist tomorrow.  HPI  Past Medical History:  Diagnosis Date   Tobacco abuse     Patient Active Problem List   Diagnosis Date Noted   Current smoker 02/12/2020   Aortic dissection, thoracic (HCC) 02/09/2020   Thoracic aortic dissection [facility])     Past Surgical History:  Procedure Laterality Date   AORTIC ARCH ANGIOGRAPHY N/A 02/08/2020   Procedure: AORTIC ARCH ANGIOGRAPHY;  Surgeon: Kathleene Hazel, MD;  [facility];  Service: Cardiovascular;  Laterality: N/A;  Aortic Root   FRACTURE SURGERY     HAND SURGERY     HERNIA REPAIR     LEFT HEART CATH AND CORONARY ANGIOGRAPHY N/A 02/08/2020   Procedure: LEFT HEART CATH AND CORONARY ANGIOGRAPHY;  Surgeon: Kathleene Hazel, MD;  [facility];  Service: Cardiovascular;  Laterality: N/A;   REPAIR OF ACUTE ASCENDING THORACIC AORTIC DISSECTION N/A 02/08/2020   Procedure: REPAIR OF TYPE A THORACIC AORTIC DISSECTION USING A 32 MM STRAIGHT GRAFT; CIRC ARREST; AND RIGHT AXILLARY ARTERY CANNULATION.;  Surgeon: Kerin Perna, MD;  Location: [facility] OR;  Service: Vascular;  Laterality: N/A;   TEE WITHOUT CARDIOVERSION N/A 02/08/2020   Procedure: TRANSESOPHAGEAL ECHOCARDIOGRAM (TEE);  Surgeon: Donata Clay, Theron Arista, MD;  Location: [facility] OR;  Service: Open Heart Surgery;  Laterality: N/A;       Home Medications    Prior to Admission medications   Medication Sig Start Date End Date Taking? Authorizing Provider  aspirin EC 325 MG EC tablet Take 1 tablet (325 mg total) by mouth daily. 02/14/20   Leary Roca, PA-C  cetirizine (ZYRTEC ALLERGY) 10 MG tablet Take 1 tablet (10 mg total) by mouth daily. 10/04/18   Law, Waylan Boga, PA-C  fluticasone (FLONASE) 50 MCG/ACT nasal spray Place 1 spray into both nostrils daily. 10/04/18   Law, Waylan Boga, PA-C  lisinopril (ZESTRIL) 10 MG tablet Take 1 tablet (10 mg total) by mouth daily. 02/14/20   Leary Roca, PA-C  metoprolol tartrate (LOPRESSOR) 25 MG tablet Take 1 tablet (25 mg total) by mouth 2 (two) times daily. 02/13/20   Leary Roca, PA-C  omeprazole (PRILOSEC) 20 MG capsule Take 1 capsule (20 mg total) by mouth 2 (two) times daily before a meal. 02/04/20   Fayrene Helper, PA-C  traMADol (ULTRAM) 50 MG tablet Take 1 tablet (50 mg total) by mouth every 6 (six) hours as needed. 02/19/20   Kerin Perna, MD    Family History Family History  Problem Relation Age of Onset  Diabetes Mother     Social History Social History   Tobacco Use   Smoking status: Current Every Day Smoker   Smokeless tobacco: Never Used  Building services engineer Use: Never used  Substance Use Topics   Alcohol use: No   Drug use: Yes    Types: Marijuana     Allergies   Patient has no known allergies.   Review of Systems Review of Systems  Constitutional: Negative for activity change, appetite change, chills, fatigue and fever.  HENT: Negative for congestion, ear pain, rhinorrhea, sinus pressure, sore throat and trouble swallowing.   Eyes: Negative for discharge and  redness.  Respiratory: Positive for shortness of breath. Negative for cough and chest tightness.   Cardiovascular: Positive for palpitations. Negative for chest pain and leg swelling.  Gastrointestinal: Negative for abdominal pain, diarrhea, nausea and vomiting.  Musculoskeletal: Negative for myalgias.  Skin: Negative for rash.  Neurological: Positive for headaches. Negative for dizziness and light-headedness.     Physical Exam Triage Vital Signs ED Triage Vitals  Enc Vitals Group     BP 03/05/20 1537 (!) 141/75     Pulse Rate 03/05/20 1537 85     Resp 03/05/20 1537 19     Temp 03/05/20 1537 98.6 F (37 C)     Temp Source 03/05/20 1537 Oral     SpO2 03/05/20 1537 97 %     Weight --      Height --      Head Circumference --      Peak Flow --      Pain Score 03/05/20 1533 0     Pain Loc --      Pain Edu? --      Excl. in GC? --    No data found.  Updated Vital Signs BP (!) 141/75 (BP Location: Left Arm)    Pulse 85    Temp 98.6 F (37 C) (Oral)    Resp 19    SpO2 97%   Visual Acuity Right Eye Distance:   Left Eye Distance:   Bilateral Distance:    Right Eye Near:   Left Eye Near:    Bilateral Near:     Physical Exam Vitals and nursing note reviewed.  Constitutional:      Appearance: He is well-developed.     Comments: No acute distress  HENT:     Head: Normocephalic and atraumatic.     Nose: Nose normal.  Eyes:     Conjunctiva/sclera: Conjunctivae normal.  Cardiovascular:     Rate and Rhythm: Normal rate and regular rhythm.  Pulmonary:     Effort: Pulmonary effort is normal. No respiratory distress.     Comments: Breathing comfortably at rest, CTABL, no wheezing, rales or other adventitious sounds auscultated Abdominal:     General: There is no distension.  Musculoskeletal:        General: Normal range of motion.     Cervical back: Neck supple.  Skin:    General: Skin is warm and dry.  Neurological:     Mental Status: He is alert and oriented to  person, place, and time.       UC Treatments / Results  Labs (all labs ordered are listed, but only abnormal results are displayed) Labs Reviewed - No data to display  EKG   Radiology No results found.  Procedures Procedures (including critical care time)  Medications Ordered in UC Medications - No data to display  Initial Impression / Assessment  and Plan / UC Course  I have reviewed the triage vital signs and the nursing notes.  Pertinent labs & imaging results that were available during my care of the patient were reviewed by me and considered in my medical decision making (see chart for details).   Patient is currently stable and asymptomatic in clinic.  His vital signs are normal.  EKG unremarkable.  Patient most likely just exerted himself too heavily following his recent history of a or aortic dissection repair.  Patient instructed to follow-up with cardiologist appointment tomorrow.  Warning signs and symptoms discussed of when to return to the emergency room.  Patient understood and had no further questions.  Discussed strict return precautions. Patient verbalized understanding and is agreeable with plan.  Final Clinical Impressions(s) / UC Diagnoses   Final diagnoses:  Palpitations  Shortness of breath     Discharge Instructions     Please take your medicines as prescribed Follow up with cardiology as planned Go to emergency room if symptoms returning/worsening    ED Prescriptions    None     PDMP not reviewed this encounter.   Lew Dawes, [place] 03/05/20 1629

## 2020-03-05 NOTE — Discharge Instructions (Signed)
Please take your medicines as prescribed Follow up with cardiology as planned Go to emergency room if symptoms returning/worsening

## 2020-03-05 NOTE — ED Notes (Signed)
EKG completed and given to Dr Tracie Harrier for review. Okay per Dr Tracie Harrier for pt to be evaluated at [facility].

## 2020-03-05 NOTE — ED Triage Notes (Signed)
Pt presents with SOB, increased heart rate that started today after running errands. States had open heart surgery on 02/08/20. Denies chest pain at this time.

## 2020-03-06 ENCOUNTER — Ambulatory Visit
Admission: RE | Admit: 2020-03-06 | Discharge: 2020-03-06 | Disposition: A | Discharge status: Home / Self Care | Payer: Self-pay | Source: Ambulatory Visit | Attending: Cardiothoracic Surgery | Admitting: Cardiothoracic Surgery

## 2020-03-06 ENCOUNTER — Ambulatory Visit (INDEPENDENT_AMBULATORY_CARE_PROVIDER_SITE_OTHER): Payer: Self-pay | Admitting: Cardiothoracic Surgery

## 2020-03-06 ENCOUNTER — Encounter: Payer: Self-pay | Admitting: Cardiothoracic Surgery

## 2020-03-06 ENCOUNTER — Other Ambulatory Visit: Payer: Self-pay | Admitting: Cardiothoracic Surgery

## 2020-03-06 VITALS — BP 125/66 | HR 69 | Temp 97.6°F | Resp 20 | Ht 75.0 in | Wt 263.0 lb

## 2020-03-06 DIAGNOSIS — Z09 Encounter for follow-up examination after completed treatment for conditions other than malignant neoplasm: Secondary | ICD-10-CM

## 2020-03-06 DIAGNOSIS — I71019 Dissection of thoracic aorta, unspecified: Secondary | ICD-10-CM

## 2020-03-06 DIAGNOSIS — I7101 Dissection of thoracic aorta: Secondary | ICD-10-CM

## 2020-03-06 NOTE — Progress Notes (Signed)
PCP is Patient, No Pcp Per Referring Provider is Kathleene Hazel*  Chief Complaint  Patient presents with  . 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  Patient had a large tear at the sinotubular junction extending up into the proximal arch.  It was repaired with a Hemashield straight graft hemiarch reconstruction.  Cannulation of the right axillary artery.  He has done well.  He is stop smoking.  He is taking his blood pressure medication.  He is anxious to increase his activity level.  He is not requiring any pain medication.  He had a chest x-ray today which I personally reviewed showing clear lung fields, sternal wires intact and stable cardiac silhouette.   Past Medical History:  Diagnosis Date  . Tobacco abuse     Past Surgical History:  Procedure Laterality Date  . AORTIC ARCH ANGIOGRAPHY N/A 02/08/2020   Procedure: AORTIC ARCH ANGIOGRAPHY;  Surgeon: Kathleene Hazel, MD;  [facility];  Service: Cardiovascular;  Laterality: N/A;  Aortic Root  . FRACTURE SURGERY    . HAND SURGERY    . HERNIA REPAIR    . LEFT HEART CATH AND CORONARY ANGIOGRAPHY N/A 02/08/2020   Procedure: LEFT HEART CATH AND CORONARY ANGIOGRAPHY;  Surgeon: Kathleene Hazel, MD;  Location: [facility];  Service: Cardiovascular;  Laterality: N/A;  . REPAIR OF ACUTE ASCENDING THORACIC AORTIC DISSECTION N/A 02/08/2020   Procedure: REPAIR OF TYPE A THORACIC AORTIC DISSECTION USING A 32 MM STRAIGHT GRAFT; CIRC ARREST; AND RIGHT AXILLARY ARTERY CANNULATION.;  Surgeon: Kerin Perna, MD;  Location: [facility] OR;  Service: Vascular;  Laterality: N/A;  . TEE WITHOUT CARDIOVERSION N/A 02/08/2020   Procedure: TRANSESOPHAGEAL ECHOCARDIOGRAM (TEE);  Surgeon: Donata Clay, Theron Arista, MD;  Location:  [facility] OR;  Service: Open Heart Surgery;  Laterality: N/A;    Family History  Problem Relation Age of Onset  . Diabetes Mother     Social History Social History   Tobacco Use  . Smoking status: Current Every Day Smoker  . Smokeless tobacco: Never Used  Vaping Use  . Vaping Use: Never used  Substance Use Topics  . Alcohol use: No  . Drug use: Yes    Types: Marijuana    Current Outpatient Medications  Medication Sig Dispense Refill  . aspirin EC 325 MG EC tablet Take 1 tablet (325 mg total) by mouth daily. 30 tablet 0  . cetirizine (ZYRTEC ALLERGY) 10 MG tablet Take 1 tablet (10 mg total) by mouth daily. 30 tablet 0  . fluticasone (FLONASE) 50 MCG/ACT nasal spray Place 1 spray into both nostrils daily. 5 g 0  . lisinopril (ZESTRIL) 10 MG tablet Take 1 tablet (10 mg total) by mouth daily. 30 tablet 2  . metoprolol tartrate (LOPRESSOR) 25 MG tablet Take 1 tablet (25 mg total) by mouth 2 (two) times daily. 60 tablet 2  . omeprazole (PRILOSEC) 20 MG capsule Take 1 capsule (20 mg total) by mouth 2 (two) times daily before a meal. 30 capsule 0  . traMADol (ULTRAM) 50 MG tablet Take 1 tablet (50 mg total) by mouth every 6 (six) hours as needed. 40 tablet 0   No current facility-administered medications for this visit.    No Known Allergies  Review of Systems  Weight stable  No symptoms of COVID-19 pneumonia No right hand weakness or numbness Good appetite Difficulty sleeping persists Soreness over the chest with minimal click sensation  BP 125/66   Pulse 69   Temp 97.6 F (36.4 C) (Skin)   Resp 20   Ht 6\' 3"  (1.905 m)   Wt 263 lb (119.3 kg)   SpO2 98% Comment: RA  BMI 32.87 kg/m  Physical Exam      Exam    General- alert and comfortable.  Sternal incision well-healed.  Good pulses present in all extremities.    Neck- no JVD, no cervical adenopathy palpable, no carotid bruit   Lungs- clear without rales, wheezes   Cor- regular rate and rhythm, no murmur , gallop    Abdomen- soft, non-tender   Extremities - warm, non-tender, minimal edema   Neuro- oriented, appropriate, no focal weakness   Diagnostic Tests: Chest x-ray personally viewed with results as noted above  Impression: Doing well 1 month after emergency repair of type a dissection Patient may drive Patient may lift up to 10 pounds maximum He should continue his current medications including blood pressure medication He understands the importance of smoking cessation  Plan: Return in 1 month for review of progress.   , MD [facility] [phone]

## 2020-03-18 ENCOUNTER — Other Ambulatory Visit: Payer: Self-pay

## 2020-03-18 MED ORDER — OMEPRAZOLE 20 MG PO CPDR: 20.0000 mg | DELAYED_RELEASE_CAPSULE | Freq: Two times a day (BID) | ORAL | 0 refills | Status: DC

## 2020-03-18 MED ORDER — METOPROLOL TARTRATE 25 MG PO TABS: 25.0000 mg | ORAL_TABLET | Freq: Two times a day (BID) | ORAL | 2 refills | Status: DC

## 2020-03-18 MED ORDER — LISINOPRIL 10 MG PO TABS
10.0000 mg | ORAL_TABLET | Freq: Every day | ORAL | 2 refills | Status: DC
Start: 2020-03-18 — End: 2020-05-21

## 2020-03-18 NOTE — Progress Notes (Signed)
Patient called requesting refills of his medications.  Omeprazole, metoprolol, and lisinopril refills were sent into the [facility] at [facility].  Dr. Donata Clay aware.  Advised patient to get refills from his Cardiologist at his follow-up appointment in future, but that since he has not seen Cardiology yet, one refill will be given.  Patient acknowledged receipt.

## 2020-03-24 NOTE — Progress Notes (Deleted)
Cardiology Office Note   Date:  03/24/2020   ID:  Aaron [name], DOB Oct 06, 1985, MRN [id number]  PCP:  Patient, No Pcp Per  Cardiologist: Dr. Excell Seltzer, MD   No chief complaint on file.    History of Present Illness: Aaron [name] is a 34 y.o. male who presents for post hospital follow-up, seen for Dr. Excell Seltzer.  Aaron [name] has a history of obesity, tobacco abuse, and marijuana abuse who presented to the ED with c/o chest and abdominal pain with nausea. EKG with ST elevation in the lateral, inferior and anterolateral leads.   Plan was for urgent cardiac catheterization to exclude CAD.  He was found to have an acute ascending aortic dissection therefore CT surgery was consulted in the cardiac Cath Lab to review studies with plans for urgent CTA of the chest and abdomen then to the OR for emergent repair. He did well in the post operative setting without complication and was ultimately discharged 02/13/20.   Per chart review, he was seen 03/05/2020 at an urgent care center for the evaluation of chest discomfort.  At that time, he reported that he was exerting himself more than normal by walking to and from the store at which time he felt shortness of breath, heart racing, fatigue and increased sweating.  Given his recent surgery, he wished for further evaluation.  EKG was unremarkable and vital signs were stable.  Symptoms felt to be secondary to early exertion.   He was seen by Dr. Maren Beach 03/06/2020 and was noted to be anxious to increase his activity level.  CXR personally reviewed by Dr. Maren Beach which was stable.  Lifting recommendations at that time were for no more than 10 pounds.  He was cleared for driving.  Plans were to return to [facility] for 1 month follow-up.    1.  Type a ascending aortic dissection s/p repair: -Doing well with no specific complaints -Seen by [facility] 03/06/2020 with lifting recommendations at no more than 10 pounds and he was cleared for driving -Continue  ASA 902 mg   2.  HTN: -Stable, -Continue lisinopril 10, beta-blocker   3.  Tobacco use: -Reports tobacco cessation, congratulated  Past Medical History:  Diagnosis Date  . Tobacco abuse     Past Surgical History:  Procedure Laterality Date  . AORTIC ARCH ANGIOGRAPHY N/A 02/08/2020   Procedure: AORTIC ARCH ANGIOGRAPHY;  Surgeon: Kathleene Hazel, MD;  [facility]: [facility];  Service: Cardiovascular;  Laterality: N/A;  Aortic Root  . FRACTURE SURGERY    . HAND SURGERY    . HERNIA REPAIR    . LEFT HEART CATH AND CORONARY ANGIOGRAPHY N/A 02/08/2020   Procedure: LEFT HEART CATH AND CORONARY ANGIOGRAPHY;  Surgeon: Kathleene Hazel, MD;  [facility];  Service: Cardiovascular;  Laterality: N/A;  . REPAIR OF ACUTE ASCENDING THORACIC AORTIC DISSECTION N/A 02/08/2020   Procedure: REPAIR OF TYPE A THORACIC AORTIC DISSECTION USING A 32 MM STRAIGHT GRAFT; CIRC ARREST; AND RIGHT AXILLARY ARTERY CANNULATION.;  Surgeon: Kerin Perna, MD;  Location: [facility] OR;  Service: Vascular;  Laterality: N/A;  . TEE WITHOUT CARDIOVERSION N/A 02/08/2020   Procedure: TRANSESOPHAGEAL ECHOCARDIOGRAM (TEE);  Surgeon: Donata Clay, Theron Arista, MD;  Location: [facility] OR;  Service: Open Heart Surgery;  Laterality: N/A;     Current Outpatient Medications  Medication Sig Dispense Refill  . aspirin EC 325 MG EC tablet Take 1 tablet (325 mg total) by mouth daily. 30 tablet 0  .  cetirizine (ZYRTEC ALLERGY) 10 MG tablet Take 1 tablet (10 mg total) by mouth daily. 30 tablet 0  . fluticasone (FLONASE) 50 MCG/ACT nasal spray Place 1 spray into both nostrils daily. 5 g 0  . lisinopril (ZESTRIL) 10 MG tablet Take 1 tablet (10 mg total) by mouth daily. 30 tablet 2  . metoprolol tartrate (LOPRESSOR) 25 MG tablet Take 1 tablet (25 mg total) by mouth 2 (two) times daily. 60 tablet 2  . omeprazole (PRILOSEC) 20 MG capsule Take 1 capsule (20 mg total) by mouth 2 (two) times daily before a meal. 30 capsule 0  .  traMADol (ULTRAM) 50 MG tablet Take 1 tablet (50 mg total) by mouth every 6 (six) hours as needed. 40 tablet 0   No current facility-administered medications for this visit.    Allergies:   Patient has no known allergies.    Social History:  The patient  reports that he has been smoking. He has never used smokeless tobacco. He reports current drug use. Drug: Marijuana. He reports that he does not drink alcohol.   Family History:  The patient's ***family history includes Diabetes in his mother.    ROS:  Please see the history of present illness.   Otherwise, review of systems are positive for {NONE DEFAULTED:18576::"none"}.   All other systems are reviewed and negative.    PHYSICAL EXAM: VS:  There were no vitals taken for this visit. , BMI There is no height or weight on file to calculate BMI.   General: Well developed, well nourished, NAD Skin: Warm, dry, intact  Head: Normocephalic, atraumatic, sclera non-icteric, no xanthomas, clear, moist mucus membranes. Neck: Negative for carotid bruits. No JVD Lungs:Clear to ausculation bilaterally. No wheezes, rales, or rhonchi. Breathing is unlabored. Cardiovascular: RRR with S1 S2. No murmurs, rubs, gallops, or LV heave appreciated. Abdomen: Soft, non-tender, non-distended with normoactive bowel sounds. No hepatomegaly, No rebound/guarding. No obvious abdominal masses. MSK: Strength and tone appear normal for age. 5/5 in all extremities Extremities: No edema. No clubbing or cyanosis. DP/PT pulses 2+ bilaterally Neuro: Alert and oriented. No focal deficits. No facial asymmetry. MAE spontaneously. Psych: Responds to questions appropriately with normal affect.     EKG:  EKG {ACTION; IS/IS VQM:08676195} ordered today. The ekg ordered today demonstrates ***   Recent Labs: 02/09/2020: Magnesium 2.4 02/12/2020: ALT 43 02/13/2020: BUN 11; Creatinine, Ser 1.25; Hemoglobin 11.0; Platelets 172; Potassium 3.5; Sodium 137    Lipid Panel No  results found for: CHOL, TRIG, HDL, CHOLHDL, VLDL, LDLCALC, LDLDIRECT    Wt Readings from Last 3 Encounters:  03/06/20 263 lb (119.3 kg)  02/13/20 275 lb 2.2 oz (124.8 kg)  10/04/18 290 lb (131.5 kg)     Other studies Reviewed: Additional studies/ records that were reviewed today include:  Review of the above records demonstrates:   Chest CTA 02/08/20:  Cardiovascular: The ascending thoracic aorta is dilated and measures approximately 6.5 cm x 6.6 cm. Dissection of the ascending thoracic aorta is also seen. This extends through the aortic arch and into the descending thoracic aorta. Further progression into the abdomen is noted to the level just before the bifurcation into the bilateral common iliac arteries. Involvement of a short portion of the proximal right brachiocephalic artery is seen (axial CT images 24 through 26, CT series number 7).  LHC 02/08/20:  Acute ascending aortic dissection Unable to selectively engage the coronary arteries but coronary artery patency demonstrated with non-selective angiography.   Recommendations: CT surgery consulted in the cath  lab to review studies. Dr. Donata Clay present. Planning for emergent repair of his aortic dissection. Pt will be taken for urgent CTA chest/abdomen then to the OR.   ASSESSMENT AND PLAN:  1.  ***   Current medicines are reviewed at length with the patient today.  The patient {ACTIONS; HAS/DOES NOT HAVE:19233} concerns regarding medicines.  The following changes have been made:  {PLAN; NO CHANGE:13088:s}  Labs/ tests ordered today include: *** No orders of the defined types were placed in this encounter.    Disposition:   FU with *** in {gen number 7-09:295747} {Days to years:10300}  Signed, Georgie Chard, NP  03/24/2020 8:05 AM    [facility] [address] [place], [place], [place]  [address] Phone: [phone]; Fax: [phone]

## 2020-03-27 ENCOUNTER — Ambulatory Visit: Payer: Self-pay | Admitting: Cardiology

## 2020-04-03 ENCOUNTER — Encounter: Payer: Self-pay | Admitting: Cardiothoracic Surgery

## 2020-04-03 ENCOUNTER — Ambulatory Visit (INDEPENDENT_AMBULATORY_CARE_PROVIDER_SITE_OTHER): Payer: Self-pay | Admitting: Cardiothoracic Surgery

## 2020-04-03 ENCOUNTER — Other Ambulatory Visit: Payer: Self-pay

## 2020-04-03 VITALS — BP 119/64 | HR 57 | Temp 97.9°F | Resp 20 | Ht 75.0 in | Wt 274.0 lb

## 2020-04-03 DIAGNOSIS — I71019 Dissection of thoracic aorta, unspecified: Secondary | ICD-10-CM

## 2020-04-03 DIAGNOSIS — I7101 Dissection of thoracic aorta: Secondary | ICD-10-CM

## 2020-04-03 DIAGNOSIS — Z09 Encounter for follow-up examination after completed treatment for conditions other than malignant neoplasm: Secondary | ICD-10-CM

## 2020-04-03 DIAGNOSIS — M314 Aortic arch syndrome [Takayasu]: Secondary | ICD-10-CM

## 2020-04-03 MED ORDER — METOPROLOL TARTRATE 25 MG PO TABS
12.5000 mg | ORAL_TABLET | Freq: Two times a day (BID) | ORAL | 2 refills | Status: DC
Start: 2020-04-03 — End: 2020-05-21

## 2020-04-03 NOTE — Progress Notes (Signed)
PCP is Patient, No Pcp Per Referring Provider is Kathleene Hazel*  Chief Complaint  Patient presents with  . Routine Post Op    HPI: 2 months after repair of aortic dissection.  He returns for follow-up and blood pressure check. The patient is anxious to increase his activity levels.  He denies any chest pain shortness of breath, just some soreness.  His blood pressure today is very well controlled and his heart rate is slow so we will reduce the dose of his metoprolol to 1/2 tablet[12.5 mg] twice a day.  Incisions are healing well.  He denies any weakness or numbness in his right hand.  Past Medical History:  Diagnosis Date  . Tobacco abuse     Past Surgical History:  Procedure Laterality Date  . AORTIC ARCH ANGIOGRAPHY N/A 02/08/2020   Procedure: AORTIC ARCH ANGIOGRAPHY;  Surgeon: Kathleene Hazel, MD;  [facility]: [facility];  Service: Cardiovascular;  Laterality: N/A;  Aortic Root  . FRACTURE SURGERY    . HAND SURGERY    . HERNIA REPAIR    . LEFT HEART CATH AND CORONARY ANGIOGRAPHY N/A 02/08/2020   Procedure: LEFT HEART CATH AND CORONARY ANGIOGRAPHY;  Surgeon: Kathleene Hazel, MD;  Location: [facility];  Service: Cardiovascular;  Laterality: N/A;  . REPAIR OF ACUTE ASCENDING THORACIC AORTIC DISSECTION N/A 02/08/2020   Procedure: REPAIR OF TYPE A THORACIC AORTIC DISSECTION USING A 32 MM STRAIGHT GRAFT; CIRC ARREST; AND RIGHT AXILLARY ARTERY CANNULATION.;  Surgeon: Kerin Perna, MD;  Location: [facility] OR;  Service: Vascular;  Laterality: N/A;  . TEE WITHOUT CARDIOVERSION N/A 02/08/2020   Procedure: TRANSESOPHAGEAL ECHOCARDIOGRAM (TEE);  Surgeon: Donata Clay, Theron Arista, MD;  Location: [facility] OR;  Service: Open Heart Surgery;  Laterality: N/A;    Family History  Problem Relation Age of Onset  . Diabetes Mother     Social History Social History   Tobacco Use  . Smoking status: Current Every Day Smoker  . Smokeless tobacco: Never Used  Vaping Use  .  Vaping Use: Never used  Substance Use Topics  . Alcohol use: No  . Drug use: Yes    Types: Marijuana    Current Outpatient Medications  Medication Sig Dispense Refill  . aspirin EC 325 MG EC tablet Take 1 tablet (325 mg total) by mouth daily. 30 tablet 0  . cetirizine (ZYRTEC ALLERGY) 10 MG tablet Take 1 tablet (10 mg total) by mouth daily. 30 tablet 0  . fluticasone (FLONASE) 50 MCG/ACT nasal spray Place 1 spray into both nostrils daily. 5 g 0  . lisinopril (ZESTRIL) 10 MG tablet Take 1 tablet (10 mg total) by mouth daily. 30 tablet 2  . metoprolol tartrate (LOPRESSOR) 25 MG tablet Take 0.5 tablets (12.5 mg total) by mouth 2 (two) times daily. 60 tablet 2  . omeprazole (PRILOSEC) 20 MG capsule Take 1 capsule (20 mg total) by mouth 2 (two) times daily before a meal. 30 capsule 0  . traMADol (ULTRAM) 50 MG tablet Take 1 tablet (50 mg total) by mouth every 6 (six) hours as needed. 40 tablet 0   No current facility-administered medications for this visit.    No Known Allergies  Review of Systems  Not smoking I not lift more than 20 pounds He understands importance of healthy diet  BP 119/64   Pulse (!) 57   Temp 97.9 F (36.6 C) (Skin)   Resp 20   Ht 6\' 3"  (1.905 m)   Wt 274  lb (124.3 kg)   SpO2 98% Comment: RA  BMI 34.25 kg/m  Physical Exam       Exam    General- alert and comfortable.  Chest incisions well-healed.    Neck- no JVD, no cervical adenopathy palpable, no carotid bruit   Lungs- clear without rales, wheezes   Cor- regular rate and rhythm, no murmur , gallop   Abdomen- soft, non-tender   Extremities - warm, non-tender, minimal edema   Neuro- oriented, appropriate, no focal weakness   Diagnostic Tests: None  Impression: Heart rate 56 with well-controlled blood pressure-we will reduce dose of metoprolol to 12.5 mg p.o. twice daily.  Continue lisinopril 10 mg a day patient may lift up to 20 pounds and return to light duties.  He understands importance of  tobacco cessation  Plan: Return in 1 month to adjust medicines and possibly stop the beta-blocker.   Mikey Bussing, MD [facility] [phone]

## 2020-04-17 ENCOUNTER — Telehealth: Payer: Self-pay

## 2020-04-17 NOTE — Telephone Encounter (Signed)
Aaron [name] contacted the office requesting Cardiac clearance to get his tooth fixed after a tooth ache.  Advised that he needed to schedule an appointment with his Cardiologist office for follow-up after surgery and clearance to have his tooth fixed.  He acknowledged receipt.

## 2020-04-18 ENCOUNTER — Other Ambulatory Visit: Payer: Self-pay

## 2020-04-18 ENCOUNTER — Encounter: Payer: Self-pay | Admitting: Cardiothoracic Surgery

## 2020-04-18 ENCOUNTER — Ambulatory Visit (INDEPENDENT_AMBULATORY_CARE_PROVIDER_SITE_OTHER): Payer: Self-pay | Admitting: Cardiothoracic Surgery

## 2020-04-18 VITALS — BP 136/64 | HR 80 | Temp 97.3°F | Resp 20 | Wt 280.0 lb

## 2020-04-18 DIAGNOSIS — I7101 Dissection of thoracic aorta: Secondary | ICD-10-CM

## 2020-04-18 DIAGNOSIS — I71019 Dissection of thoracic aorta, unspecified: Secondary | ICD-10-CM

## 2020-04-18 DIAGNOSIS — K0889 Other specified disorders of teeth and supporting structures: Secondary | ICD-10-CM

## 2020-04-18 NOTE — Progress Notes (Signed)
PCP is Patient, No Pcp Per Referring Provider is No ref. provider found  Chief Complaint  Patient presents with  . Thoracic Aortic Dissection    f/u, s/p repair 02/08/20    HPI: Patient presents to the office today with a form from his dentist to clear him for dental extraction under local anesthesia.  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  He has a painful left mandibular tooth assessed by his dentist and recommended for extraction due to infection.  He has been placed on oral amoxicillin and his dentist planned extraction in the next 48 hours.  The patient has recovered well from his aortic surgery.  He is in sinus rhythm, good ventricular function, minimal AI and no symptoms of heart failure or angina.  I signed the authorization for the dental extraction with strong communication to the patient that he take his antibiotics including 2 g of amoxicillin 1 hour before the procedure.  I discussed the problems of possible prosthetic graft infection from bacteremia related to dental extraction if he did not take his antibiotics as prescribed.  I was satisfied he was very clear on the details of taking the antibiotics   Past Medical History:  Diagnosis Date  . Tobacco abuse     Past Surgical History:  Procedure Laterality Date  . AORTIC ARCH ANGIOGRAPHY N/A 02/08/2020   Procedure: AORTIC ARCH ANGIOGRAPHY;  Surgeon: Kathleene Hazel, MD;  [facility]: [facility];  Service: Cardiovascular;  Laterality: N/A;  Aortic Root  . FRACTURE SURGERY    . HAND SURGERY    . HERNIA REPAIR    . LEFT HEART CATH AND CORONARY ANGIOGRAPHY N/A 02/08/2020   Procedure: LEFT HEART CATH AND CORONARY ANGIOGRAPHY;  Surgeon: Kathleene Hazel, MD;  [facility]: [facility];  Service: Cardiovascular;  Laterality: N/A;  . REPAIR OF ACUTE ASCENDING THORACIC AORTIC DISSECTION N/A 02/08/2020   Procedure: REPAIR OF TYPE A THORACIC AORTIC  DISSECTION USING A 32 MM STRAIGHT GRAFT; CIRC ARREST; AND RIGHT AXILLARY ARTERY CANNULATION.;  Surgeon: Kerin Perna, MD;  Location: [facility] OR;  Service: Vascular;  Laterality: N/A;  . TEE WITHOUT CARDIOVERSION N/A 02/08/2020   Procedure: TRANSESOPHAGEAL ECHOCARDIOGRAM (TEE);  Surgeon: Donata Clay, Theron Arista, MD;  Location: [facility] OR;  Service: Open Heart Surgery;  Laterality: N/A;    Family History  Problem Relation Age of Onset  . Diabetes Mother     Social History Social History   Tobacco Use  . Smoking status: Current Every Day Smoker  . Smokeless tobacco: Never Used  Vaping Use  . Vaping Use: Never used  Substance Use Topics  . Alcohol use: No  . Drug use: Yes    Types: Marijuana    Current Outpatient Medications  Medication Sig Dispense Refill  . aspirin EC 325 MG EC tablet Take 1 tablet (325 mg total) by mouth daily. 30 tablet 0  . cetirizine (ZYRTEC ALLERGY) 10 MG tablet Take 1 tablet (10 mg total) by mouth daily. 30 tablet 0  . fluticasone (FLONASE) 50 MCG/ACT nasal spray Place 1 spray into both nostrils daily. 5 g 0  . lisinopril (ZESTRIL) 10 MG tablet Take 1 tablet (10 mg total) by mouth daily. 30 tablet 2  . metoprolol tartrate (LOPRESSOR) 25 MG tablet Take 0.5 tablets (12.5 mg total) by mouth 2 (two) times daily. 60 tablet 2  . omeprazole (PRILOSEC) 20 MG capsule Take 1 capsule (20 mg total) by mouth 2 (  two) times daily before a meal. 30 capsule 0  . traMADol (ULTRAM) 50 MG tablet Take 1 tablet (50 mg total) by mouth every 6 (six) hours as needed. 40 tablet 0   No current facility-administered medications for this visit.    No Known Allergies  Review of Systems  No problems other than the left tooth pain  BP 136/64 (BP Location: Right Arm, Patient Position: Sitting, Cuff Size: Large)   Pulse 80   Temp (!) 97.3 F (36.3 C) (Skin)   Resp 20   Wt 280 lb (127 kg)   SpO2 98% Comment: RA  BMI 35.00 kg/m  Physical Exam Alert and comfortable Lungs clear Regular rhythm  without murmur or gallop Dental incision well-healed Left mandibular tooth peers fractured no purulence noted Diagnostic Tests: None  Impression: Patient authorized to go ahead with single dental extraction under antibiotic coverage  Plan: Keep next appointment as scheduled  Mikey Bussing, MD [facility] [phone]

## 2020-04-19 ENCOUNTER — Encounter: Payer: Self-pay | Admitting: Cardiothoracic Surgery

## 2020-04-19 DIAGNOSIS — K0889 Other specified disorders of teeth and supporting structures: Secondary | ICD-10-CM | POA: Insufficient documentation

## 2020-05-08 ENCOUNTER — Encounter: Payer: Self-pay | Admitting: Cardiothoracic Surgery

## 2020-05-08 ENCOUNTER — Ambulatory Visit: Payer: Self-pay | Admitting: Cardiothoracic Surgery

## 2020-05-21 ENCOUNTER — Telehealth: Payer: Self-pay

## 2020-05-21 MED ORDER — METOPROLOL TARTRATE 25 MG PO TABS
12.5000 mg | ORAL_TABLET | Freq: Two times a day (BID) | ORAL | 2 refills | Status: DC
Start: 2020-05-21 — End: 2020-05-22

## 2020-05-21 MED ORDER — OMEPRAZOLE 20 MG PO CPDR: 20.0000 mg | DELAYED_RELEASE_CAPSULE | Freq: Two times a day (BID) | ORAL | 0 refills | Status: DC

## 2020-05-21 MED ORDER — LISINOPRIL 10 MG PO TABS
10.0000 mg | ORAL_TABLET | Freq: Every day | ORAL | 2 refills | Status: DC
Start: 2020-05-21 — End: 2020-05-22

## 2020-05-21 NOTE — Telephone Encounter (Signed)
Patient contacted the office again stating that he has run out of his medications and was currently not taking anything for blood pressure.  He was not sure what medication he was to stop taking because he did not make it to his last appoitnemtn with Dr. Donata Clay.  Advised that he may discontinue his beta blocker at his next appointment and that he needed to continue to take it until he is seen in the office.  He acknowledged receipt.  He is s/p Repair Aortic Dissection 02/08/20 and reiterated the need to take all medications and to make time to see his Cardiologist.  He has no showed last appointments due to money but did state he would contact his Cardiology office to get his appointment rescheduled.  He is scheduled to see Dr. Donata Clay this week in clinic.

## 2020-05-22 ENCOUNTER — Ambulatory Visit (INDEPENDENT_AMBULATORY_CARE_PROVIDER_SITE_OTHER): Payer: Self-pay | Admitting: Cardiothoracic Surgery

## 2020-05-22 ENCOUNTER — Encounter: Payer: Self-pay | Admitting: Cardiothoracic Surgery

## 2020-05-22 ENCOUNTER — Other Ambulatory Visit: Payer: Self-pay

## 2020-05-22 VITALS — BP 150/80 | HR 75 | Resp 20 | Ht 75.0 in | Wt 280.0 lb

## 2020-05-22 DIAGNOSIS — I10 Essential (primary) hypertension: Secondary | ICD-10-CM

## 2020-05-22 DIAGNOSIS — Z09 Encounter for follow-up examination after completed treatment for conditions other than malignant neoplasm: Secondary | ICD-10-CM

## 2020-05-22 DIAGNOSIS — I71019 Dissection of thoracic aorta, unspecified: Secondary | ICD-10-CM

## 2020-05-22 DIAGNOSIS — I7101 Dissection of thoracic aorta: Secondary | ICD-10-CM

## 2020-05-22 MED ORDER — LISINOPRIL 10 MG PO TABS
10.0000 mg | ORAL_TABLET | Freq: Every day | ORAL | 6 refills | Status: DC
Start: 2020-05-22 — End: 2020-06-26

## 2020-05-22 MED ORDER — METOPROLOL TARTRATE 25 MG PO TABS
25.0000 mg | ORAL_TABLET | Freq: Two times a day (BID) | ORAL | 6 refills | Status: DC
Start: 2020-05-22 — End: 2021-03-11

## 2020-05-22 NOTE — Progress Notes (Signed)
PCP is Patient, No Pcp Per Referring Provider is Kathleene Hazel*  Chief Complaint  Patient presents with  . 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  The patient has done well but has had to interrupt his blood pressure medications.  Fortunately he called our office and discussed the situation with one of our nurses and presents today to clarify the blood pressure medications he needs to continue on a daily basis indefinitely.  These include lisinopril 10 mg daily and metoprolol tartrate 25 mg twice daily.  Despite not having any blood pressure medications for over a week his blood pressure today is only mildly elevated at 150/80 He denies chest pain shortness of breath or fatigue. Past Medical History:  Diagnosis Date  . Tobacco abuse   He does not smoke tobacco  Past Surgical History:  Procedure Laterality Date  . AORTIC ARCH ANGIOGRAPHY N/A 02/08/2020   Procedure: AORTIC ARCH ANGIOGRAPHY;  Surgeon: Kathleene Hazel, MD;  [facility];  Service: Cardiovascular;  Laterality: N/A;  Aortic Root  . FRACTURE SURGERY    . HAND SURGERY    . HERNIA REPAIR    . LEFT HEART CATH AND CORONARY ANGIOGRAPHY N/A 02/08/2020   Procedure: LEFT HEART CATH AND CORONARY ANGIOGRAPHY;  Surgeon: Kathleene Hazel, MD;  [facility];  Service: Cardiovascular;  Laterality: N/A;  . REPAIR OF ACUTE ASCENDING THORACIC AORTIC DISSECTION N/A 02/08/2020   Procedure: REPAIR OF TYPE A THORACIC AORTIC DISSECTION USING A 32 MM STRAIGHT GRAFT; CIRC ARREST; AND RIGHT AXILLARY ARTERY CANNULATION.;  Surgeon: Kerin Perna, MD;  Location: [facility] OR;  Service: Vascular;  Laterality: N/A;  . TEE WITHOUT CARDIOVERSION N/A 02/08/2020   Procedure: TRANSESOPHAGEAL ECHOCARDIOGRAM (TEE);  Surgeon: Donata Clay, Theron Arista, MD;  Location: [facility] OR;  Service: Open Heart  Surgery;  Laterality: N/A;    Family History  Problem Relation Age of Onset  . Diabetes Mother     Social History Social History   Tobacco Use  . Smoking status: Current Every Day Smoker  . Smokeless tobacco: Never Used  Vaping Use  . Vaping Use: Never used  Substance Use Topics  . Alcohol use: No  . Drug use: Yes    Types: Marijuana    Current Outpatient Medications  Medication Sig Dispense Refill  . aspirin EC 325 MG EC tablet Take 1 tablet (325 mg total) by mouth daily. 30 tablet 0  . cetirizine (ZYRTEC ALLERGY) 10 MG tablet Take 1 tablet (10 mg total) by mouth daily. 30 tablet 0  . fluticasone (FLONASE) 50 MCG/ACT nasal spray Place 1 spray into both nostrils daily. 5 g 0  . lisinopril (ZESTRIL) 10 MG tablet Take 1 tablet (10 mg total) by mouth daily. 30 tablet 2  . metoprolol tartrate (LOPRESSOR) 25 MG tablet Take 0.5 tablets (12.5 mg total) by mouth 2 (two) times daily. 60 tablet 2  . omeprazole (PRILOSEC) 20 MG capsule Take 1 capsule (20 mg total) by mouth 2 (two) times daily before a meal. 30 capsule 0  . traMADol (ULTRAM) 50 MG tablet Take 1 tablet (50 mg total) by mouth every 6 (six) hours as needed. 40 tablet 0   No current facility-administered medications for this visit.    No Known Allergies  Review of Systems: Weight stable No cough shortness of breath or chest pain  No abdominal pain No edema No dizziness  BP (!) 150/80   Pulse 75   Resp 20   Ht 6\' 3"  (1.905 m)   Wt 280 lb (127 kg)   SpO2 98% Comment: RA  BMI 35.00 kg/m  Physical Exam:      Exam    General- alert and comfortable.  Sternal incision well-healed.    Neck- no JVD, no cervical adenopathy palpable, no carotid bruit   Lungs- clear without rales, wheezes   Cor- regular rate and rhythm, no murmur , gallop   Abdomen- soft, non-tender   Extremities - warm, non-tender, minimal edema.  Good peripheral pulses.   Neuro- oriented, appropriate, no focal weakness   Diagnostic Tests: Last  chest x-ray clear  Impression: Excellent recovery after repair of acute ascending aortic dissection but problems with noncompliance with his blood pressure medication.  This appears to be due to some confusion over which medications he needs.  This was clarified during the office visit and I provided him with a medications sheet and provided him with new prescriptions with refills.  Plan: Patient return in 1 month for follow-up to confirm he is taking his blood pressure medication and to check his blood pressure.   , MD [facility] [phone]

## 2020-06-26 ENCOUNTER — Encounter: Payer: Self-pay | Admitting: Cardiothoracic Surgery

## 2020-06-26 ENCOUNTER — Ambulatory Visit (INDEPENDENT_AMBULATORY_CARE_PROVIDER_SITE_OTHER): Payer: Self-pay | Admitting: Cardiothoracic Surgery

## 2020-06-26 ENCOUNTER — Other Ambulatory Visit: Payer: Self-pay

## 2020-06-26 VITALS — BP 149/80 | HR 60 | Resp 20 | Ht 75.0 in | Wt 297.0 lb

## 2020-06-26 DIAGNOSIS — Z0130 Encounter for examination of blood pressure without abnormal findings: Secondary | ICD-10-CM | POA: Insufficient documentation

## 2020-06-26 DIAGNOSIS — I71019 Dissection of thoracic aorta, unspecified: Secondary | ICD-10-CM

## 2020-06-26 DIAGNOSIS — I7101 Dissection of thoracic aorta: Secondary | ICD-10-CM

## 2020-06-26 DIAGNOSIS — I10 Essential (primary) hypertension: Secondary | ICD-10-CM

## 2020-06-26 DIAGNOSIS — Z09 Encounter for follow-up examination after completed treatment for conditions other than malignant neoplasm: Secondary | ICD-10-CM

## 2020-06-26 MED ORDER — LISINOPRIL 10 MG PO TABS
10.0000 mg | ORAL_TABLET | Freq: Every day | ORAL | 6 refills | Status: DC
Start: 2020-06-26 — End: 2021-01-31

## 2020-06-26 NOTE — Progress Notes (Signed)
PCP is Patient, No Pcp Per Referring Provider is No ref. provider found  Chief Complaint  Patient presents with  . Routine Post Op    1 month f/u for BP check, HX of aortic dissection repair    HPI: Patient returns for scheduled follow-up for blood pressure check and review medications.  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 Following repair he had normal LV function and good aortic valve function of a trileaflet valve.  He has had issues with medications and proper blood pressure control postoperatively.  He states he is now following his prescription medication and taking the lisinopril and metoprolol.  Denies complaints of chest pain.  Past Medical History:  Diagnosis Date  . Tobacco abuse     Past Surgical History:  Procedure Laterality Date  . AORTIC ARCH ANGIOGRAPHY N/A 02/08/2020   Procedure: AORTIC ARCH ANGIOGRAPHY;  Surgeon: Kathleene Hazel, MD;  [facility];  Service: Cardiovascular;  Laterality: N/A;  Aortic Root  . FRACTURE SURGERY    . HAND SURGERY    . HERNIA REPAIR    . LEFT HEART CATH AND CORONARY ANGIOGRAPHY N/A 02/08/2020   Procedure: LEFT HEART CATH AND CORONARY ANGIOGRAPHY;  Surgeon: Kathleene Hazel, MD;  [facility];  Service: Cardiovascular;  Laterality: N/A;  . REPAIR OF ACUTE ASCENDING THORACIC AORTIC DISSECTION N/A 02/08/2020   Procedure: REPAIR OF TYPE A THORACIC AORTIC DISSECTION USING A 32 MM STRAIGHT GRAFT; CIRC ARREST; AND RIGHT AXILLARY ARTERY CANNULATION.;  Surgeon: Kerin Perna, MD;  Location: [facility] OR;  Service: Vascular;  Laterality: N/A;  . TEE WITHOUT CARDIOVERSION N/A 02/08/2020   Procedure: TRANSESOPHAGEAL ECHOCARDIOGRAM (TEE);  Surgeon: Donata Clay, Theron Arista, MD;  Location: [facility] OR;  Service: Open Heart Surgery;  Laterality: N/A;    Family History  Problem Relation Age of Onset  . Diabetes Mother     Social History Social History   Tobacco Use  .  Smoking status: Current Every Day Smoker  . Smokeless tobacco: Never Used  Vaping Use  . Vaping Use: Never used  Substance Use Topics  . Alcohol use: No  . Drug use: Yes    Types: Marijuana    Current Outpatient Medications  Medication Sig Dispense Refill  . aspirin EC 325 MG EC tablet Take 1 tablet (325 mg total) by mouth daily. 30 tablet 0  . lisinopril (ZESTRIL) 10 MG tablet Take 1 tablet (10 mg total) by mouth daily. 30 tablet 6  . metoprolol tartrate (LOPRESSOR) 25 MG tablet Take 1 tablet (25 mg total) by mouth 2 (two) times daily. 60 tablet 6   No current facility-administered medications for this visit.    No Known Allergies  Review of Systems    No new complaints  BP (!) 149/80   Pulse 60   Resp 20   Ht 6\' 3"  (1.905 m)   Wt 297 lb (134.7 kg)   SpO2 99% Comment: RA  BMI 37.12 kg/m  Physical Exam       Exam    General- alert and comfortable.  Sternal incision well-healed.    Neck- no JVD, no cervical adenopathy palpable, no carotid bruit   Lungs- clear without rales, wheezes   Cor- regular rate and rhythm, no murmur , gallop   Abdomen- soft, non-tender   Extremities - warm, non-tender, minimal edema   Neuro- oriented, appropriate, no focal weakness  Diagnostic Tests: None  Impression: Blood pressure adequately controlled.  Patient is now compliant with his blood pressure medications. Understands that when the viral runs out that he can call the drugstore and order a refill which has been provided in the initial prescription.  Plan: Patient returns for a 43-month postop CTA to assess the aortic repair and see Dr. Brynda Greathouse.  Mikey Bussing, MD [facility] [phone]

## 2020-07-23 ENCOUNTER — Telehealth: Payer: Self-pay | Admitting: Cardiovascular Disease

## 2020-07-23 NOTE — Telephone Encounter (Signed)
Patient states he has been smoking mariajuana and his mother told him, that someone she knows had stents and it was caused by smoking weed. He states he spoke with his PCP about whether smoking could cause him to need stents, but they said it was alright. He would like to know if he needs to stop. Please advise.

## 2020-07-29 NOTE — Telephone Encounter (Signed)
Left message to call back if pt needs to discuss further.

## 2020-08-05 DIAGNOSIS — Z736 Limitation of activities due to disability: Secondary | ICD-10-CM

## 2020-08-25 NOTE — Progress Notes (Deleted)
Cardiology Office Note   Date:  08/25/2020   ID:  Aaron [name], DOB 1986-03-27, MRN [id number]  PCP:  Patient, No Pcp Per  Cardiologist:  Georgie Chard, NP   No chief complaint on file.     History of Present Illness: Aaron [name] is a 35 y.o. male who presents for follow up, seen for    Aaron [name] has a history of obesity, tobacco abuse and marijuana use who presented to the ED with chest and abdominal pain with nausea.  EKG showed ST elevation in the lateral, inferior and anterior lateral leads.  Plan was for urgent cardiac catheterization to exclude CAD however he was found to have an acute ascending aortic dissection therefore CT surgery was consulted in the cardiac Cath Lab to review studies with plans for urgent CTA of the chest and abdomen then to the OR for emergent repair.  He did well in the postoperative setting without complication and was ultimately discharged 02/13/2020.  Per chart review, he was seen 03/05/2020 at an urgent care center for the evaluation of chest pain.  At that time, he reported that he was exerting himself more than normal by walking to and from the store at which time he felt shortness of breath, palpitations, fatigue and increased sweating.  Given his recent surgery, he wished for further evaluation.  EKG was unremarkable and vital signs are stable.  Symptoms were felt to be secondary to early exertion.  He was then seen by Dr. Maren Beach 03/06/2020 and was noted to be anxious due to increased activity levels.  CXR personally reviewed by Dr. Maren Beach which was stable.  Lifting recommendations continue to be no more than 10 pounds and he was cleared for driving.  Plans were to return to see CTS for 1 month follow-up.  He has been seen several times by Dr. Donata Clay since surgery most to evaluate blood pressure and to monitor his HR. His metoprolol was reduced to 12.5 due to slow HR. He has had issues with compliance with medications and  follow up appointments. He was last seen by Dr. Donata Clay and reported that he had been compliant with his meds at that point. Plan was for 6 month follow up with transition to Dr. Cliffton Asters due to Dr. Zenaida Niece Trigt's retirement.   1.  Type a ascending aortic dissection s/p repair: -Doing well with no specific complaints -Has been followed very closely by TC TS mainly for blood pressure control and medication compliance issues   2.  HTN: -Stable, -Continue current regimen  3.  Tobacco use: -Reports cessation, congratulated   Past Medical History:  Diagnosis Date  . Tobacco abuse     Past Surgical History:  Procedure Laterality Date  . AORTIC ARCH ANGIOGRAPHY N/A 02/08/2020   Procedure: AORTIC ARCH ANGIOGRAPHY;  Surgeon: Kathleene Hazel, MD;  [facility];  Service: Cardiovascular;  Laterality: N/A;  Aortic Root  . FRACTURE SURGERY    . HAND SURGERY    . HERNIA REPAIR    . LEFT HEART CATH AND CORONARY ANGIOGRAPHY N/A 02/08/2020   Procedure: LEFT HEART CATH AND CORONARY ANGIOGRAPHY;  Surgeon: Kathleene Hazel, MD;  [facility];  Service: Cardiovascular;  Laterality: N/A;  . REPAIR OF ACUTE ASCENDING THORACIC AORTIC DISSECTION N/A 02/08/2020   Procedure: REPAIR OF TYPE A THORACIC AORTIC DISSECTION USING A 32 MM STRAIGHT GRAFT; CIRC ARREST; AND RIGHT AXILLARY ARTERY CANNULATION.;  Surgeon: Kerin Perna, MD;  Location[facility];  Service: Vascular;  Laterality: N/A;  . TEE WITHOUT CARDIOVERSION N/A 02/08/2020   Procedure: TRANSESOPHAGEAL ECHOCARDIOGRAM (TEE);  Surgeon: Donata Clay, Theron Arista, MD;  Location: [facility] OR;  Service: Open Heart Surgery;  Laterality: N/A;     Current Outpatient Medications  Medication Sig Dispense Refill  . aspirin EC 325 MG EC tablet Take 1 tablet (325 mg total) by mouth daily. 30 tablet 0  . lisinopril (ZESTRIL) 10 MG tablet Take 1 tablet (10 mg total) by mouth daily. 30 tablet 6  . metoprolol tartrate (LOPRESSOR) 25 MG  tablet Take 1 tablet (25 mg total) by mouth 2 (two) times daily. 60 tablet 6   No current facility-administered medications for this visit.    Allergies:   Patient has no known allergies.    Social History:  The patient  reports that he has been smoking. He has never used smokeless tobacco. He reports current drug use. Drug: Marijuana. He reports that he does not drink alcohol.   Family History:  The patient's ***family history includes Diabetes in his mother.    ROS:  Please see the history of present illness.   Otherwise, review of systems are positive for {NONE DEFAULTED:18576::"none"}.   All other systems are reviewed and negative.    PHYSICAL EXAM: VS:  There were no vitals taken for this visit. , BMI There is no height or weight on file to calculate BMI. GEN: Well nourished, well developed, in no acute distress HEENT: normal Neck: no JVD, carotid bruits, or masses Cardiac: ***RRR; no murmurs, rubs, or gallops,no edema  Respiratory:  clear to auscultation bilaterally, normal work of breathing GI: soft, nontender, nondistended, + BS MS: no deformity or atrophy Skin: warm and dry, no rash Neuro:  Strength and sensation are intact Psych: euthymic mood, full affect   EKG:  EKG {ACTION; IS/IS YPP:50932671} ordered today. The ekg ordered today demonstrates ***   Recent Labs: 02/09/2020: Magnesium 2.4 02/12/2020: ALT 43 02/13/2020: BUN 11; Creatinine, Ser 1.25; Hemoglobin 11.0; Platelets 172; Potassium 3.5; Sodium 137    Lipid Panel No results found for: CHOL, TRIG, HDL, CHOLHDL, VLDL, LDLCALC, LDLDIRECT    Wt Readings from Last 3 Encounters:  06/26/20 297 lb (134.7 kg)  05/22/20 280 lb (127 kg)  04/18/20 280 lb (127 kg)      Other studies Reviewed: Additional studies/ records that were reviewed today include: ***. Review of the above records demonstrates: ***   ASSESSMENT AND PLAN:  1.  ***   Current medicines are reviewed at length with the patient today.   The patient {ACTIONS; HAS/DOES NOT HAVE:19233} concerns regarding medicines.  The following changes have been made:  {PLAN; NO CHANGE:13088:s}  Labs/ tests ordered today include: *** No orders of the defined types were placed in this encounter.    Disposition:   FU with *** in {gen number 2-45:809983} {Days to years:10300}  Signed, Georgie Chard, NP  08/25/2020 6:44 PM    [facility] [address] [place], [place], [place]  [address] Phone: [phone]; Fax: [phone]

## 2020-08-28 ENCOUNTER — Ambulatory Visit: Payer: Self-pay | Admitting: Cardiology

## 2020-09-25 ENCOUNTER — Other Ambulatory Visit: Payer: Self-pay | Admitting: Thoracic Surgery (Cardiothoracic Vascular Surgery)

## 2020-09-25 DIAGNOSIS — I7101 Dissection of thoracic aorta: Secondary | ICD-10-CM

## 2020-09-25 DIAGNOSIS — I71019 Dissection of thoracic aorta, unspecified: Secondary | ICD-10-CM

## 2020-11-08 ENCOUNTER — Ambulatory Visit (INDEPENDENT_AMBULATORY_CARE_PROVIDER_SITE_OTHER): Payer: Self-pay | Admitting: Thoracic Surgery (Cardiothoracic Vascular Surgery)

## 2020-11-08 ENCOUNTER — Ambulatory Visit
Admission: RE | Admit: 2020-11-08 | Discharge: 2020-11-08 | Disposition: A | Discharge status: Home / Self Care | Payer: Self-pay | Source: Ambulatory Visit | Attending: Thoracic Surgery (Cardiothoracic Vascular Surgery) | Admitting: Thoracic Surgery (Cardiothoracic Vascular Surgery)

## 2020-11-08 ENCOUNTER — Other Ambulatory Visit: Payer: Self-pay

## 2020-11-08 ENCOUNTER — Encounter: Payer: Self-pay | Admitting: Thoracic Surgery (Cardiothoracic Vascular Surgery)

## 2020-11-08 VITALS — BP 125/74 | HR 72 | Resp 20 | Ht 75.0 in | Wt 307.0 lb

## 2020-11-08 DIAGNOSIS — I7101 Dissection of thoracic aorta: Secondary | ICD-10-CM

## 2020-11-08 DIAGNOSIS — I71019 Dissection of thoracic aorta, unspecified: Secondary | ICD-10-CM

## 2020-11-08 DIAGNOSIS — Z09 Encounter for follow-up examination after completed treatment for conditions other than malignant neoplasm: Secondary | ICD-10-CM

## 2020-11-08 MED ORDER — IOPAMIDOL (ISOVUE-370) INJECTION 76%
75.0000 mL | Freq: Once | INTRAVENOUS | Status: AC | PRN
  Administered 2020-11-08: 75 mL via INTRAVENOUS

## 2020-11-08 NOTE — Progress Notes (Signed)
      [address]       [place] [address]             [phone]        VAIL VUNCANNON [facility] Medical Record #[id number] Date of Birth: [birth year]  Referring: Kathleene Hazel* Primary Care: Patient, No Pcp Per (Inactive) Primary Cardiologist:Christopher Clifton James, MD  Reason for visit:   follow-up  History of Present Illness:     35 year old male presents for 105-month follow-up after undergoing a type a dissection repair with Dr. Maren Beach.  He has no complaints today.  Physical Exam: BP 125/74 (BP Location: Right Arm, Patient Position: Sitting)   Pulse 72   Resp 20   Ht 6\' 3"  (1.905 m)   Wt (!) 307 lb (139.3 kg)   SpO2 98% Comment: RA  BMI 38.37 kg/m   Alert NAD Incision clean.   Abdomen soft, ND No peripheral edema   Diagnostic Studies & Laboratory data:     Assessment / Plan:   35 year old male status post type a dissection repair.  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  He has a residual type B dissection which also appears stable. Given the size of his aortic root, we will obtain another CT aortogram in 6 months.  His blood pressure appears well controlled, and he is refraining from weight lifting and eating healthier diet.  We will see him back in 6 months.   Tangie Stay O Lj Miyamoto 11/08/2020 1:20 PM

## 2020-12-29 ENCOUNTER — Other Ambulatory Visit: Payer: Self-pay

## 2020-12-29 ENCOUNTER — Emergency Department (HOSPITAL_COMMUNITY)
Admission: EM | Admit: 2020-12-29 | Discharge: 2020-12-29 | Disposition: A | Discharge status: Home / Self Care | Payer: Self-pay | Attending: Emergency Medicine | Admitting: Emergency Medicine

## 2020-12-29 ENCOUNTER — Ambulatory Visit (HOSPITAL_COMMUNITY)
Admission: EM | Admit: 2020-12-29 | Discharge: 2020-12-29 | Disposition: A | Discharge status: Other Acute Inpatient Hospital | Payer: Self-pay | Attending: Medical Oncology | Admitting: Medical Oncology

## 2020-12-29 ENCOUNTER — Encounter (HOSPITAL_COMMUNITY): Payer: Self-pay

## 2020-12-29 ENCOUNTER — Emergency Department (HOSPITAL_COMMUNITY): Payer: Self-pay

## 2020-12-29 ENCOUNTER — Encounter (HOSPITAL_COMMUNITY): Payer: Self-pay | Admitting: Medical Oncology

## 2020-12-29 DIAGNOSIS — R079 Chest pain, unspecified: Secondary | ICD-10-CM | POA: Insufficient documentation

## 2020-12-29 DIAGNOSIS — F17200 Nicotine dependence, unspecified, uncomplicated: Secondary | ICD-10-CM | POA: Insufficient documentation

## 2020-12-29 DIAGNOSIS — I10 Essential (primary) hypertension: Secondary | ICD-10-CM | POA: Insufficient documentation

## 2020-12-29 DIAGNOSIS — Z79899 Other long term (current) drug therapy: Secondary | ICD-10-CM | POA: Insufficient documentation

## 2020-12-29 DIAGNOSIS — R001 Bradycardia, unspecified: Secondary | ICD-10-CM | POA: Insufficient documentation

## 2020-12-29 DIAGNOSIS — I7101 Dissection of thoracic aorta: Secondary | ICD-10-CM | POA: Insufficient documentation

## 2020-12-29 LAB — BASIC METABOLIC PANEL
Anion gap: 8 (ref 5–15)
BUN: 11 mg/dL (ref 6–20)
CO2: 28 mmol/L (ref 22–32)
Calcium: 9.7 mg/dL (ref 8.9–10.3)
Chloride: 102 mmol/L (ref 98–111)
Creatinine, Ser: 1.45 mg/dL — ABNORMAL HIGH (ref 0.61–1.24)
GFR, Estimated: >60 mL/min (ref >60)
Glucose, Bld: 97 mg/dL (ref 70–99)
Potassium: 4 mmol/L (ref 3.5–5.1)
Sodium: 138 mmol/L (ref 135–145)

## 2020-12-29 LAB — CBC
HCT: 44.3 % (ref 39.0–52.0)
Hemoglobin: 14.8 g/dL (ref 13.0–17.0)
MCH: 31 pg (ref 26.0–34.0)
MCHC: 33.4 g/dL (ref 30.0–36.0)
MCV: 92.7 fL (ref 80.0–100.0)
Platelets: 168 10*3/uL (ref 150–400)
RBC: 4.78 MIL/uL (ref 4.22–5.81)
RDW: 12.7 % (ref 11.5–15.5)
WBC: 4.6 10*3/uL (ref 4.0–10.5)
nRBC: 0 % (ref 0.0–0.2)

## 2020-12-29 LAB — TROPONIN I (HIGH SENSITIVITY)
Troponin I (High Sensitivity): 5 ng/L (ref <18)
Troponin I (High Sensitivity): 7 ng/L (ref <18)

## 2020-12-29 MED ORDER — IOHEXOL 350 MG/ML SOLN
100.0000 mL | Freq: Once | INTRAVENOUS | Status: AC | PRN
  Administered 2020-12-29: 100 mL via INTRAVENOUS

## 2020-12-29 NOTE — ED Triage Notes (Signed)
Patient complains of intermittent chest pain since last night. Has had a valve repair 1 yesr ago. Denies any associated symptoms and reports that the pain very mild on arrival

## 2020-12-29 NOTE — ED Provider Notes (Signed)
[facility]    CSN: [id number] Arrival date & time: 12/29/20  1105      History   Chief Complaint Chief Complaint  Patient presents with   Chest Pain    HPI Aaron [name] is a 35 y.o. male.   HPI  Chest Pain: Patient with an extensive cardiac history presents with chest pain that started last night.  Chest pain described as ache like 3-4 in nature of left chest and occurred after lifting heavy boxes.  Chest pain did occur with some shortness of breath and a bit of dizziness but without nausea.  Pain resolved last night with rest but returned as of this morning around 9 am. It has since resolved.  He has tried nothing yet for symptoms.   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   Postop check 03/06/2020   Current smoker 02/12/2020   Aortic dissection, thoracic (HCC) 02/09/2020   Thoracic aortic dissection [facility])     Past Surgical History:  Procedure Laterality Date   AORTIC ARCH ANGIOGRAPHY N/A 02/08/2020   Procedure: AORTIC ARCH ANGIOGRAPHY;  Surgeon: Kathleene Hazel, MD;  [facility];  Service: Cardiovascular;  Laterality: N/A;  Aortic Root   FRACTURE SURGERY     HAND SURGERY     HERNIA REPAIR     LEFT HEART CATH AND CORONARY ANGIOGRAPHY N/A 02/08/2020   Procedure: LEFT HEART CATH AND CORONARY ANGIOGRAPHY;  Surgeon: Kathleene Hazel, MD;  [facility];  Service: Cardiovascular;  Laterality: N/A;   REPAIR OF ACUTE ASCENDING THORACIC AORTIC DISSECTION N/A 02/08/2020   Procedure: REPAIR OF TYPE A THORACIC AORTIC DISSECTION USING A 32 MM STRAIGHT GRAFT; CIRC ARREST; AND RIGHT AXILLARY ARTERY CANNULATION.;  Surgeon: Kerin Perna, MD;  Location[facility] OR;  Service: Vascular;  Laterality: N/A;    TEE WITHOUT CARDIOVERSION N/A 02/08/2020   Procedure: TRANSESOPHAGEAL ECHOCARDIOGRAM (TEE);  Surgeon: Donata Clay, Theron Arista, MD;  Location: [facility] OR;  Service: Open Heart Surgery;  Laterality: N/A;       Home Medications    Prior to Admission medications   Medication Sig Start Date End Date Taking? Authorizing Provider  lisinopril (ZESTRIL) 10 MG tablet Take 1 tablet (10 mg total) by mouth daily. 06/26/20   Kerin Perna, MD  metoprolol tartrate (LOPRESSOR) 25 MG tablet Take 1 tablet (25 mg total) by mouth 2 (two) times daily. 05/22/20   Kerin Perna, MD    Family History Family History  Problem Relation Age of Onset   Diabetes Mother     Social History Social History   Tobacco Use   Smoking status: Every Day    Pack years: 0.00   Smokeless tobacco: Never  Vaping Use   Vaping Use: Never used  Substance Use Topics   Alcohol use: No   Drug use: Yes    Types: Marijuana     Allergies   Patient has no known allergies.   Review of Systems Review of Systems  As stated above in HPI Physical Exam Triage Vital Signs ED Triage Vitals [12/29/20 1117]  Enc Vitals Group     BP (!) 149/72     Pulse Rate 66     Resp 18     Temp 98.6  F (37 C)     Temp src      SpO2 100 %     Weight      Height      Head Circumference      Peak Flow      Pain Score      Pain Loc      Pain Edu?      Excl. in GC?    No data found.  Updated Vital Signs BP (!) 149/72   Pulse 66   Temp 98.6 F (37 C)   Resp 18   SpO2 100%   Physical Exam Vitals and nursing note reviewed.  Constitutional:      General: He is not in acute distress.    Appearance: He is well-developed. He is not ill-appearing, toxic-appearing or diaphoretic.  HENT:     Head: Normocephalic and atraumatic.  Eyes:     Pupils: Pupils are equal, round, and reactive to light.  Neck:     Vascular: No JVD.     Comments: No carotid bruits auscultated bilaterally Cardiovascular:     Rate and Rhythm: Normal rate and  regular rhythm.     Heart sounds: Normal heart sounds.  Pulmonary:     Effort: Pulmonary effort is normal.     Breath sounds: Normal breath sounds.  Chest:     Comments: Chest pain is not reproducible Musculoskeletal:     Right lower leg: No edema.     Left lower leg: No edema.  Skin:    Coloration: Skin is not cyanotic or pale.     Findings: No erythema.     Nails: There is no clubbing.  Neurological:     Mental Status: He is alert and oriented to person, place, and time.     UC Treatments / Results  Labs (all labs ordered are listed, but only abnormal results are displayed) Labs Reviewed - No data to display  EKG   Radiology No results found.  Procedures Procedures (including critical care time)  Medications Ordered in UC Medications - No data to display  Initial Impression / Assessment and Plan / UC Course  I have reviewed the triage vital signs and the nursing notes.  Pertinent labs & imaging results that were available during my care of the patient were reviewed by me and considered in my medical decision making (see chart for details).     New. Given his elevated BP reading and extensive cardiac history he will need a higher level of elevation in the emergency room.  Patient prefers to transport himself as he does not actively have any pain.  Discussed red flag signs and symptoms.  Final Clinical Impressions(s) / UC Diagnoses   Final diagnoses:  None   Discharge Instructions   None    ED Prescriptions   None    PDMP not reviewed this encounter.   Rushie Chestnut, [place] 12/29/20 1149

## 2020-12-29 NOTE — ED Triage Notes (Signed)
Pt reports CP that stated last night . Pt also reports stent placement August 2021.

## 2020-12-29 NOTE — ED Provider Notes (Signed)
[facility] EMERGENCY DEPARTMENT Provider Note   CSN: [id number] Arrival date & time: 12/29/20  1149     History Chief Complaint  Patient presents with  . Chest Pain    DEEGAN VALENTINO is a 35 y.o. male.  HPI      Midday yesterday while lifting a light object developed middle chest pain, woke at 2AM, pain 4/10, sharp pain at times, at times more lasting pain. Improved now and no current pain.  Sharp pain without radiation no pain in neck, jaw or back. No associated symptoms, no lightheadedness, numbness, weakness, dyspnea, nausea, or diaphoresis.  Given history of dissection and repair in the past did go to urgent care where he was evaluated and recommended ed transfer.    Past Medical History:  Diagnosis Date  . Tobacco abuse     Patient Active Problem List   Diagnosis Date Noted  . Blood pressure check 06/26/2020  . Hypertension 05/22/2020  . Pain, dental 04/19/2020  . 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 Postop check 03/06/2020  . Current smoker 02/12/2020  . 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 Thoracic aortic dissection [facility])     Past Surgical History:  Procedure Laterality Date  . AORTIC ARCH ANGIOGRAPHY N/A 02/08/2020   Procedure: AORTIC ARCH ANGIOGRAPHY;  Surgeon: Kathleene Hazel, MD;  Location: [facility] INVASIVE CV LAB;  Service: Cardiovascular;  Laterality: N/A;  Aortic Root  . FRACTURE SURGERY    . HAND SURGERY    . HERNIA REPAIR    . LEFT HEART CATH AND CORONARY ANGIOGRAPHY N/A 02/08/2020   Procedure: LEFT HEART CATH AND CORONARY ANGIOGRAPHY;  Surgeon: Kathleene Hazel, MD;  Location: [facility] INVASIVE CV LAB;  Service: Cardiovascular;  Laterality: N/A;  . REPAIR OF ACUTE ASCENDING THORACIC AORTIC DISSECTION N/A 02/08/2020   Procedure: REPAIR OF TYPE A THORACIC AORTIC DISSECTION USING A 32 MM STRAIGHT GRAFT; CIRC ARREST; AND RIGHT AXILLARY ARTERY CANNULATION.;  Surgeon: Kerin Perna, MD;   Location: [facility] OR;  Service: Vascular;  Laterality: N/A;  . TEE WITHOUT CARDIOVERSION N/A 02/08/2020   Procedure: TRANSESOPHAGEAL ECHOCARDIOGRAM (TEE);  Surgeon: Donata Clay, Theron Arista, MD;  Location: [facility] OR;  Service: Open Heart Surgery;  Laterality: N/A;       Family History  Problem Relation Age of Onset  . Diabetes Mother     Social History   Tobacco Use  . Smoking status: Every Day    Pack years: 0.00  . Smokeless tobacco: Never  Vaping Use  . Vaping Use: Never used  Substance Use Topics  . Alcohol use: No  . Drug use: Yes    Types: Marijuana    Home Medications Prior to Admission medications   Medication Sig Start Date End Date Taking? Authorizing Provider  lisinopril (ZESTRIL) 10 MG tablet Take 1 tablet (10 mg total) by mouth daily. 06/26/20  Yes Kerin Perna, MD  metoprolol tartrate (LOPRESSOR) 25 MG tablet Take 1 tablet (25 mg total) by mouth 2 (two) times daily. Patient taking differently: Take 12.5 mg by mouth in the morning and at bedtime. 05/22/20  Yes Kerin Perna, MD    Allergies    Patient has no known allergies.  Review of Systems   Review of Systems  Constitutional:  Negative for diaphoresis and fever.  HENT:  Negative for sore throat.   Eyes:  Negative for visual disturbance.  Respiratory:  Negative for cough and shortness of breath.   Cardiovascular:  Positive for chest  pain.  Gastrointestinal:  Negative for abdominal pain, nausea and vomiting.  Genitourinary:  Negative for difficulty urinating.  Musculoskeletal:  Negative for back pain and neck stiffness.  Skin:  Negative for rash.  Neurological:  Negative for syncope, light-headedness, numbness and headaches.   Physical Exam Updated Vital Signs BP 130/62   Pulse 66   Temp 98 F (36.7 C)   Resp 16   Ht 6\' 3"  (1.905 m)   Wt (!) 139.3 kg   SpO2 98%   BMI 38.37 kg/m   Physical Exam Vitals and nursing note reviewed.  Constitutional:      General: He is not in acute distress.     Appearance: He is well-developed. He is not diaphoretic.  HENT:     Head: Normocephalic and atraumatic.  Eyes:     Conjunctiva/sclera: Conjunctivae normal.  Cardiovascular:     Rate and Rhythm: Normal rate and regular rhythm.     Heart sounds: Normal heart sounds. No murmur heard.   No friction rub. No gallop.  Pulmonary:     Effort: Pulmonary effort is normal. No respiratory distress.     Breath sounds: Normal breath sounds. No wheezing or rales.  Abdominal:     General: There is no distension.     Palpations: Abdomen is soft.     Tenderness: There is no abdominal tenderness. There is no guarding.  Musculoskeletal:     Cervical back: Normal range of motion.  Skin:    General: Skin is warm and dry.  Neurological:     Mental Status: He is alert and oriented to person, place, and time.    ED Results / Procedures / Treatments   Labs (all labs ordered are listed, but only abnormal results are displayed) Labs Reviewed  BASIC METABOLIC PANEL - Abnormal; Notable for the following components:      Result Value   Creatinine, Ser 1.45 (*)    All other components within normal limits  CBC  TROPONIN I (HIGH SENSITIVITY)  TROPONIN I (HIGH SENSITIVITY)    EKG EKG Interpretation  Date/Time:  Sunday December 29 2020 11:58:50 EDT Ventricular Rate:  57 PR Interval:  150 QRS Duration: 104 QT Interval:  434 QTC Calculation: 422 R Axis:   77 Text Interpretation: Sinus bradycardia Incomplete right bundle branch block Borderline ECG No significant change since last tracing Confirmed by [id number] (Alvira Monday) on 12/29/2020 12:29:14 PM  Radiology DG Chest 2 View  Result Date: 12/29/2020 CLINICAL DATA:  Chest pain for the past 2 days.  Smoker. EXAM: CHEST - 2 VIEW COMPARISON:  03/06/2020 FINDINGS: Normal sized heart. Clear lungs. Median sternotomy wires. Unremarkable bones. IMPRESSION: No active cardiopulmonary disease. Electronically Signed   By: 03/08/2020 M.D.   On: 12/29/2020 12:43    CT Angio Chest/Abd/Pel for Dissection W and/or Wo Contrast  Result Date: 12/29/2020 CLINICAL DATA:  35 year old male with chest, abdominal and pelvic pain. History of type A ascending aortic dissection and repair on 02/10/2020. EXAM: CT ANGIOGRAPHY CHEST, ABDOMEN AND PELVIS TECHNIQUE: Non-contrast CT of the chest was initially obtained. Multidetector CT imaging through the chest, abdomen and pelvis was performed using the standard protocol during bolus administration of intravenous contrast. Multiplanar reconstructed images and MIPs were obtained and reviewed to evaluate the vascular anatomy. CONTRAST:  02/12/2020 OMNIPAQUE IOHEXOL 350 MG/ML SOLN COMPARISON:  11/08/2020 and prior CTs FINDINGS: CTA CHEST FINDINGS Cardiovascular: Ascending thoracic aortic repair/surgical changes are again identified. Aortic root is unchanged measuring 4.9 cm in greatest diameter. Unchanged descending  thoracic aortic dissection extending into the abdomen noted with unchanged caliber of the thoracic aorta. Both the true and false lumen are patent. Unchanged thrombus along the SUPERIOR aspect of this dissection is again noted. There is no evidence of pericardial effusion. Heart size is normal.  No new findings are noted. Mediastinum/Nodes: No mediastinal mass or hematoma noted. No enlarged lymph nodes are identified. No significant change. Lungs/Pleura: There is no evidence of airspace disease, consolidation, mass, suspicious nodule, pleural effusion or pneumothorax. Musculoskeletal: No acute are acute abnormality. Review of the MIP images confirms the above findings. CTA ABDOMEN AND PELVIS FINDINGS VASCULAR Aorta: The abdominal aorta is unchanged in appearance with dissection extending from the thoracic aorta and terminating just above the bifurcation. There is no evidence of thoracic aortic aneurysm. But the true and false lumen are patent. Celiac: Dissection abuts the origin with patent celiac artery noted. SMA: Dissection abuts the  origin with patent SMA noted. Renals: Patent without dissection IMA: Patent without dissection Review of the MIP images confirms the above findings. NON-VASCULAR Hepatobiliary: The liver and gallbladder are unremarkable. No biliary dilatation. Pancreas: Unremarkable Spleen: Unremarkable Adrenals/Urinary Tract: The kidneys, adrenal glands and bladder are unremarkable except for a probable small LEFT renal cyst. Stomach/Bowel: Stomach is within normal limits. Appendix appears normal. No evidence of bowel wall thickening, distention, or inflammatory changes. Lymphatic: No abnormal lymph nodes identified. Reproductive: Unremarkable Other: No ascites, pneumoperitoneum or focal collection. Musculoskeletal: No acute or suspicious bony abnormalities. Review of the MIP images confirms the above findings. IMPRESSION: 1. No acute abnormality. 2. Unchanged descending thoracic aortic and abdominal aortic dissection with unchanged dilatation of the aortic root measuring 4.9 cm. No evidence of abdominal aortic aneurysm. Ascending thoracic aortic surgical changes again noted. 3. No acute/new abnormalities within the chest, abdomen or pelvis. Electronically Signed   By: Harmon Pier M.D.   On: 12/29/2020 16:34    Procedures Procedures   Medications Ordered in ED Medications  iohexol (OMNIPAQUE) 350 MG/ML injection 100 mL (100 mLs Intravenous Contrast Given 12/29/20 1602)    ED Course  I have reviewed the triage vital signs and the nursing notes.  Pertinent labs & imaging results that were available during my care of the patient were reviewed by me and considered in my medical decision making (see chart for details).    MDM Rules/Calculators/A&P                          35yo male with history of Type A dissection repair with remaining dilated aortic root, residual type B dissection, who presents with concern for chest pain.  EKG without significant changes. Troponin negative. No dyspnea and have low clinical  suspicion for PE. Ordered dissection study given past surgical history and sharp nature of pain.  Dissection study and troponin pending at time of transfer of care.     Final Clinical Impression(s) / ED Diagnoses Final diagnoses:  Nonspecific chest pain    Rx / DC Orders ED Discharge Orders     None        Alvira Monday, MD 12/30/20 1530

## 2020-12-29 NOTE — ED Provider Notes (Signed)
Patient signed out to me at 3 PM awaiting CT dissection study.  History of dissection in the past status postrepair about a half a year ago.  Has some chest pain that is very mild he states after lifting some boxes at work.  Dissection study was unremarkable.  Unchanged dilation of the aortic root measuring at 4.9 cm.  Overall stable study.  Likely MSK type pain.  EKG and troponins unremarkable.  Discharged in good condition.  This chart was dictated using voice recognition software.  Despite best efforts to proofread,  errors can occur which can change the documentation meaning.    Virgina Norfolk, DO 12/29/20 1643

## 2021-01-31 ENCOUNTER — Other Ambulatory Visit: Payer: Self-pay | Admitting: *Deleted

## 2021-01-31 MED ORDER — LISINOPRIL 10 MG PO TABS
10.0000 mg | ORAL_TABLET | Freq: Every day | ORAL | 1 refills | Status: DC
Start: 2021-01-31 — End: 2021-05-27

## 2021-01-31 NOTE — Progress Notes (Signed)
Aaron [name] contacted the office requesting a refill of his lisinopril. Patient had a repair of a dissected aorta in August of 2021 by Dr. Donata Clay. Patient has not followed up with his cardiologist since then. Per Dr. Cliffton Asters, lisinopril has been reordered. Patient has an appt with cardiology scheduled for September. Patient acknowledges receipt.

## 2021-03-03 ENCOUNTER — Telehealth: Payer: Self-pay | Admitting: Cardiovascular Disease

## 2021-03-03 NOTE — Telephone Encounter (Signed)
*  STAT* If patient is at the pharmacy, call can be transferred to refill team.   1. Which medications need to be refilled? (please list name of each medication and dose if known) lisinopril (ZESTRIL) 10 MG tablet, metoprolol tartrate (LOPRESSOR) 25 MG tablet  2. Which pharmacy/location (including street and city if local pharmacy) is medication to be sent to? [facility] - [place] - [address]  3. Do they need a 30 day or 90 day supply? 90  Patient states that he called [facility] and they stated that our office needed to refill these prescriptions.

## 2021-03-03 NOTE — Telephone Encounter (Signed)
Pt has an upcoming appt 03/05/21, at your office. Please address

## 2021-03-04 NOTE — Telephone Encounter (Signed)
Pt has appt tomorrow, 03/05/21. Will address at appt.

## 2021-03-04 NOTE — Progress Notes (Signed)
Cardiology Office Note:    Date:  03/05/2021   ID:  Aaron [name], DOB [birth year], MRN [id number]  PCP:  Patient, No Pcp Per (Inactive)   [facility] Providers Cardiologist:  Verne Carrow, MD     Referring MD: No ref. provider found   Post hospital follow-up for MSK chest discomfort  History of Present Illness:    Aaron [name] is a 35 y.o. male with a hx of obesity, tobacco abuse, marijuana abuse, chest pain, abdominal pain, type a ascending aortic dissection status postrepair 02/08/2020.  He also has a residual type B dissection.  He was last seen by cardiology while he was admitted in the hospital on 02/13/2020.  During that time he was progressing well after his aortic dissection repair.  His blood pressure was slightly elevated and lisinopril was added to his medication regimen.  His beta-blocker was continued as well as his aspirin.  He presented to the emergency department on 12/29/2020.  He reported back/chest pain while he was lifting boxes at work.  A chest/abdomen/pelvis CT showed no acute abnormalities, unchanged ascending thoracic aortic and abdominal aortic dissection with some trace dilation of the aortic root 9.9 cm.  No evidence abdominal aortic aneurysm.  His EKG at that time showed sinus bradycardia with incomplete right bundle branch block 57 bpm it was felt that his chest discomfort was musculoskeletal in nature.  He presents the clinic today for follow-up evaluation states he has not noticed any further episodes of chest discomfort.  He reports that he has not yet returned to work full-time.  He has been walking 2-3 miles daily.  He denies chest discomfort with increased physical activity.  He has been monitoring his blood pressure at home and reports that it runs in the 130s over 70s.  We reviewed his labs and diagnostics from the emergency department.  Expressed understanding.  He does report that he does not smoke cigarettes but does smoke  marijuana on a regular basis.  I will order a BMP today, have him continue to increase his physical activity as tolerated, avoid strenuous lifting type activities, keeping a blood pressure log, and have him follow-up in 6 months.    Today he denies chest pain, shortness of breath, lower extremity edema, fatigue, palpitations, melena, hematuria, hemoptysis, diaphoresis, weakness, presyncope, syncope, orthopnea, and PND.   Past Medical History:  Diagnosis Date   Tobacco abuse     Past Surgical History:  Procedure Laterality Date   AORTIC ARCH ANGIOGRAPHY N/A 02/08/2020   Procedure: AORTIC ARCH ANGIOGRAPHY;  Surgeon: Kathleene Hazel, MD;  Location: [facility] INVASIVE CV LAB;  Service: Cardiovascular;  Laterality: N/A;  Aortic Root   FRACTURE SURGERY     HAND SURGERY     HERNIA REPAIR     LEFT HEART CATH AND CORONARY ANGIOGRAPHY N/A 02/08/2020   Procedure: LEFT HEART CATH AND CORONARY ANGIOGRAPHY;  Surgeon: Kathleene Hazel, MD;  Location: [facility] INVASIVE CV LAB;  Service: Cardiovascular;  Laterality: N/A;   REPAIR OF ACUTE ASCENDING THORACIC AORTIC DISSECTION N/A 02/08/2020   Procedure: REPAIR OF TYPE A THORACIC AORTIC DISSECTION USING A 32 MM STRAIGHT GRAFT; CIRC ARREST; AND RIGHT AXILLARY ARTERY CANNULATION.;  Surgeon: Kerin Perna, MD;  Location: [facility] OR;  Service: Vascular;  Laterality: N/A;   TEE WITHOUT CARDIOVERSION N/A 02/08/2020   Procedure: TRANSESOPHAGEAL ECHOCARDIOGRAM (TEE);  Surgeon: Donata Clay, Theron Arista, MD;  Location: [facility] OR;  Service: Open Heart Surgery;  Laterality: N/A;    Current Medications: Current  Meds  Medication Sig   lisinopril (ZESTRIL) 10 MG tablet Take 1 tablet (10 mg total) by mouth daily.   metoprolol tartrate (LOPRESSOR) 25 MG tablet Take 1 tablet (25 mg total) by mouth 2 (two) times daily. (Patient taking differently: Take 12.5 mg by mouth in the morning and at bedtime.)     Allergies:   Patient has no known allergies.   Social History   Socioeconomic  History   Marital status: Single    Spouse name: Not on file   Number of children: Not on file   Years of education: Not on file   Highest education level: Not on file  Occupational History   Not on file  Tobacco Use   Smoking status: Every Day   Smokeless tobacco: Never  Vaping Use   Vaping Use: Never used  Substance and Sexual Activity   Alcohol use: No   Drug use: Yes    Types: Marijuana   Sexual activity: Not on file  Other Topics Concern   Not on file  Social History Narrative   Not on file   Social Determinants of Health   Financial Resource Strain: Not on file  Food Insecurity: Not on file  Transportation Needs: Not on file  Physical Activity: Not on file  Stress: Not on file  Social Connections: Not on file     Family History: The patient's family history includes Diabetes in his mother.  ROS:   Please see the history of present illness.     All other systems reviewed and are negative.   Risk Assessment/Calculations:           Physical Exam:    VS:  BP 136/76   Pulse 67   Ht 6\' 2"  (1.88 m)   Wt (!) 302 lb (137 kg)   SpO2 98%   BMI 38.77 kg/m     Wt Readings from Last 3 Encounters:  03/05/21 (!) 302 lb (137 kg)  12/29/20 (!) 307 lb (139.3 kg)  11/08/20 (!) 307 lb (139.3 kg)     GEN:  Well nourished, well developed in no acute distress HEENT: Normal NECK: No JVD; No carotid bruits LYMPHATICS: No lymphadenopathy CARDIAC: RRR, no murmurs, rubs, gallops RESPIRATORY:  Clear to auscultation without rales, wheezing or rhonchi  ABDOMEN: Soft, non-tender, non-distended MUSCULOSKELETAL:  No edema; No deformity  SKIN: Warm and dry NEUROLOGIC:  Alert and oriented x 3 PSYCHIATRIC:  Normal affect    EKGs/Labs/Other Studies Reviewed:    The following studies were reviewed today:  CT chest abdomen pelvis 12/29/2020 IMPRESSION: 1. No acute abnormality. 2. Unchanged descending thoracic aortic and abdominal aortic dissection with unchanged  dilatation of the aortic root measuring 4.9 cm. No evidence of abdominal aortic aneurysm. Ascending thoracic aortic surgical changes again noted. 3. No acute/new abnormalities within the chest, abdomen or pelvis.  EKG: None today.  Recent Labs: 12/29/2020: BUN 11; Creatinine, Ser 1.45; Hemoglobin 14.8; Platelets 168; Potassium 4.0; Sodium 138  Recent Lipid Panel No results found for: CHOL, TRIG, HDL, CHOLHDL, VLDL, LDLCALC, LDLDIRECT  ASSESSMENT & PLAN    Chest wall pain-presented to the emergency department on 12/29/2020 with complaints of chest discomfort.  He had been lifting boxes at his place of employment when the pain developed.  Due to his previous aortic dissection he became concerned and presented to the emergency department.  Follow-up chest CT abdomen pelvis showed no acute abnormalities. Continue to monitor Heart healthy low-sodium diet Maintain physical activity  Essential hypertension-BP today 136/76.  Well-controlled at home. Continue lisinopril, metoprolol Heart healthy low-sodium diet-salty 6 given Maintain physical activity Maintain blood pressure log Order BMP  Status post thoracic aortic aneurysm repair-denies further episodes of back and chest discomfort.  Underwent a sending thoracic aortic dissection repair 02/08/2020 by Dr. Donata Clay.  Recent CT T chest abdomen pelvis showed stable aneurysm repair. Continue good blood pressure control  Disposition: Follow-up with Dr. Clifton James in  6 months.       Medication Adjustments/Labs and Tests Ordered: Current medicines are reviewed at length with the patient today.  Concerns regarding medicines are outlined above.  No orders of the defined types were placed in this encounter.  No orders of the defined types were placed in this encounter.   There are no Patient Instructions on file for this visit.   Signed, Aaron Asters, NP  03/05/2021 11:09 AM      Notice: This dictation was prepared with Dragon  dictation along with smaller phrase technology. Any transcriptional errors that result from this process are unintentional and may not be corrected upon review.  I spent 14 minutes examining this patient, reviewing medications, and using patient centered shared decision making involving her cardiac care.  Prior to her visit I spent greater than 20 minutes reviewing her past medical history,  medications, and prior cardiac tests.

## 2021-03-05 ENCOUNTER — Encounter (HOSPITAL_BASED_OUTPATIENT_CLINIC_OR_DEPARTMENT_OTHER): Payer: Self-pay | Admitting: General Practice

## 2021-03-05 ENCOUNTER — Ambulatory Visit (INDEPENDENT_AMBULATORY_CARE_PROVIDER_SITE_OTHER): Payer: Self-pay | Admitting: General Practice

## 2021-03-05 ENCOUNTER — Other Ambulatory Visit: Payer: Self-pay

## 2021-03-05 VITALS — BP 136/76 | HR 67 | Ht 74.0 in | Wt 302.0 lb

## 2021-03-05 DIAGNOSIS — R0789 Other chest pain: Secondary | ICD-10-CM

## 2021-03-05 DIAGNOSIS — Z8679 Personal history of other diseases of the circulatory system: Secondary | ICD-10-CM

## 2021-03-05 DIAGNOSIS — Z9889 Other specified postprocedural states: Secondary | ICD-10-CM

## 2021-03-05 DIAGNOSIS — I10 Essential (primary) hypertension: Secondary | ICD-10-CM

## 2021-03-05 NOTE — Patient Instructions (Addendum)
Medication Instructions:  Your physician recommends that you continue on your current medications as directed. Please refer to the Current Medication list given to you today.  *If you need a refill on your cardiac medications before your next appointment, please call your pharmacy*   Lab Work: Your physician recommends that you have blood work today: BMET  If you have labs (blood work) drawn today and your tests are completely normal, you will receive your results only by: MyChart Message (if you have MyChart) OR A paper copy in the mail If you have any lab test that is abnormal or we need to change your treatment, we will call you to review the results.  Follow-Up: At [facility], you and your health needs are our priority.  As part of our continuing mission to provide you with exceptional heart care, we have created designated Provider Care Teams.  These Care Teams include your primary Cardiologist (physician) and Advanced Practice Providers (APPs -  Physician Assistants and Nurse Practitioners) who all work together to provide you with the care you need, when you need it.  We recommend signing up for the patient portal called "MyChart".  Sign up information is provided on this After Visit Summary.  MyChart is used to connect with patients for Virtual Visits (Telemedicine).  Patients are able to view lab/test results, encounter notes, upcoming appointments, etc.  Non-urgent messages can be sent to your provider as well.   To learn more about what you can do with MyChart, go to [email or web address].    Your next appointment:   6 month(s)  The format for your next appointment:   In Person  Provider:   You may see Verne Carrow, MD or one of the following Advanced Practice Providers on your designated Care Team:   Ronie Spies, PA-C Jacolyn Reedy, PA-C   Other Instructions Edd Fabian, NP has recommended keeping a blood pressure log, starting a low salt diet, and  increasing aerobic activity as tolerated. Please see information below. Thank you!  Tips to Measure your Blood Pressure Correctly  To determine whether you have hypertension, a medical professional will take a blood pressure reading. How you prepare for the test, the position of your arm, and other factors can change a blood pressure reading by 10% or more. That could be enough to hide high blood pressure, start you on a drug you don't really need, or lead your doctor to incorrectly adjust your medications.  National and international guidelines offer specific instructions for measuring blood pressure. If a doctor, nurse, or medical assistant isn't doing it right, don't hesitate to ask him or her to get with the guidelines.  Here's what you can do to ensure a correct reading:  Don't drink a caffeinated beverage or smoke during the 30 minutes before the test.  Sit quietly for five minutes before the test begins.  During the measurement, sit in a chair with your feet on the floor and your arm supported so your elbow is at about heart level.  The inflatable part of the cuff should completely cover at least 80% of your upper arm, and the cuff should be placed on bare skin, not over a shirt.  Don't talk during the measurement.  Have your blood pressure measured twice, with a brief break in between. If the readings are different by 5 points or more, have it done a third time.  In 2017, new guidelines from the American Heart Association, the [employer] of Cardiology, and nine other  health organizations lowered the diagnosis of high blood pressure to 130/80 mm Hg or higher for all adults. The guidelines also redefined the various blood pressure categories to now include normal, elevated, Stage 1 hypertension, Stage 2 hypertension, and hypertensive crisis (see "Blood pressure categories").  Blood pressure categories  Blood pressure category SYSTOLIC (upper number)  DIASTOLIC (lower number)  Normal  Less than 120 mm Hg and Less than 80 mm Hg  Elevated 120-129 mm Hg and Less than 80 mm Hg  High blood pressure: Stage 1 hypertension 130-139 mm Hg or 80-89 mm Hg  High blood pressure: Stage 2 hypertension 140 mm Hg or higher or 90 mm Hg or higher  Hypertensive crisis (consult your doctor immediately) Higher than 180 mm Hg and/or Higher than 120 mm Hg  Source: American Heart Association and American Stroke Association. For more on getting your blood pressure under control, buy Controlling Your Blood Pressure, a Special Health Report from [facility].   Blood Pressure Log   Date   Time  Blood Pressure  Position  Example: Nov 1 9 AM 124/78 sitting                                                        How to Increase Your Level of Physical Activity Getting regular physical activity is important for your overall health and well-being. Most people do not get enough exercise. There are easy ways to increase your level of physical activity, even if you have not been very active in the past or if you are just starting out. What are the benefits of physical activity? Physical activity has many short-term and long-term benefits. Being active on a regular basis can improve your physical and mental health as well as provide other benefits. Physical health benefits Helping you lose weight or maintain a healthy weight. Strengthening your muscles and bones. Reducing your risk of certain long-term (chronic) diseases, including heart disease, cancer, and diabetes. Being able to move around more easily and for longer periods of time without getting tired (increased endurance or stamina). Improving your ability to fight off illness (enhanced immunity). Being able to sleep better. Helping you stay healthy as you get older, including: Helping you stay mobile, or capable of walking and moving around. Preventing accidents, such as falls. Increasing life expectancy. Mental  health benefits Boosting your mood and improving your self-esteem. Lowering your chance of having mental health problems, such as depression or anxiety. Helping you feel good about your body. Other benefits Finding new sources of fun and enjoyment. Meeting new people who share a common interest. Before you begin If you have a chronic illness or have not been active for a while, check with your health care provider about how to get started. Ask your health care provider what activities are safe for you. Start out slowly. Walking or doing some simple chair exercises is a good place to start, especially if you have not been active before or for a long time. Set goals that you can work toward. Ask your health care provider how much exercise is best for you. In general, most adults should: Do moderate-intensity exercise for at least 150 minutes each week (30 minutes on most days of the week) or vigorous exercise for at least 75 minutes each week, or a combination of  these. Moderate-intensity exercise can include walking at a quick pace, biking, yoga, water aerobics, or gardening. Vigorous exercise involves activities that take more effort, such as jogging or running, playing sports, swimming laps, or jumping rope. Do strength exercises on at least 2 days each week. This can include weight lifting, body weight exercises, and resistance-band exercises. How to be more physically active Make a plan  Try to find activities that you enjoy. You are more likely to commit to an exercise routine if it does not feel like a chore. If you have bone or joint problems, choose low-impact exercises, like walking or swimming. Use these tips for being successful with an exercise plan: Find a workout partner for accountability. Join a group or class, such as an aerobics class, cycling class, or sports team. Make family time active. Go for a walk, bike, or swim. Include a variety of exercises each week. Consider  using a fitness tracker, such as a mobile phone app or a device worn like a watch, that will count the number of steps you take each day. Many people strive to reach 10,000 steps a day. Find ways to be active in your daily routines Besides your formal exercise plans, you can find ways to do physical activity during your daily routines, such as: Walking or biking to work or to the store. Taking the stairs instead of the elevator. Parking farther away from the door at work or at the store. Planning walking meetings. Walking around while you are on the phone. Where to find more information Centers for Disease Control and Prevention: CampusCasting.com.pt President's Council on Fitness, Sports & Nutrition: www.fitness.gov ChooseMyPlate: http://www.harvey.com/ Contact a health care provider if: You have headaches, muscle aches, or joint pain that is concerning. You feel dizzy or light-headed while exercising. You faint. You feel your heart skipping, racing, or fluttering. You have chest pain while exercising. Summary Exercise benefits your mind and body at any age, even if you are just starting out. If you have a chronic illness or have not been active for a while, check with your health care provider before increasing your physical activity. Choose activities that are safe and enjoyable for you. Ask your health care provider what activities are safe for you. Start slowly. Tell your health care provider if you have problems as you start to increase your activity level. This information is not intended to replace advice given to you by your health care provider. Make sure you discuss any questions you have with your health care provider. Document Revised: 10/04/2020 Document Reviewed: 10/04/2020 Elsevier Patient Education  2022 [employer].

## 2021-03-06 LAB — BASIC METABOLIC PANEL
BUN/Creatinine Ratio: 8 — ABNORMAL LOW (ref 9–20)
BUN: 11 mg/dL (ref 6–20)
CO2: 24 mmol/L (ref 20–29)
Calcium: 9.9 mg/dL (ref 8.7–10.2)
Chloride: 102 mmol/L (ref 96–106)
Creatinine, Ser: 1.38 mg/dL — ABNORMAL HIGH (ref 0.76–1.27)
Glucose: 88 mg/dL (ref 65–99)
Potassium: 4.3 mmol/L (ref 3.5–5.2)
Sodium: 140 mmol/L (ref 134–144)
eGFR: 69 mL/min/{1.73_m2} (ref >59)

## 2021-03-11 ENCOUNTER — Telehealth: Payer: Self-pay | Admitting: Cardiovascular Disease

## 2021-03-11 MED ORDER — METOPROLOL TARTRATE 25 MG PO TABS
25.0000 mg | ORAL_TABLET | Freq: Two times a day (BID) | ORAL | 6 refills | Status: DC
Start: 2021-03-11 — End: 2021-08-13

## 2021-03-11 NOTE — Telephone Encounter (Signed)
Refill for Metoprolol has been sent to [facility], per pt's request.

## 2021-03-11 NOTE — Telephone Encounter (Signed)
*  STAT* If patient is at the pharmacy, call can be transferred to refill team.   1. Which medications need to be refilled? (please list name of each medication and dose if known) metoprolol   2. Which pharmacy/location (including street and city if local pharmacy) is medication to be sent to? [facility]  3. Do they need a 30 day or 90 day supply? 30 not sure   Patient is out

## 2021-03-28 ENCOUNTER — Other Ambulatory Visit: Payer: Self-pay | Admitting: *Deleted

## 2021-03-28 DIAGNOSIS — I71012 Dissection of descending thoracic aorta: Secondary | ICD-10-CM

## 2021-03-28 NOTE — Progress Notes (Unsigned)
Ct

## 2021-05-09 ENCOUNTER — Ambulatory Visit: Payer: Self-pay | Admitting: Thoracic Surgery (Cardiothoracic Vascular Surgery)

## 2021-05-09 ENCOUNTER — Inpatient Hospital Stay: Admission: RE | Admit: 2021-05-09 | Payer: Self-pay | Source: Ambulatory Visit

## 2021-05-12 ENCOUNTER — Ambulatory Visit
Admission: RE | Admit: 2021-05-12 | Discharge: 2021-05-12 | Disposition: A | Discharge status: Home / Self Care | Payer: Self-pay | Source: Ambulatory Visit | Attending: Thoracic Surgery (Cardiothoracic Vascular Surgery) | Admitting: Thoracic Surgery (Cardiothoracic Vascular Surgery)

## 2021-05-12 DIAGNOSIS — I71012 Dissection of descending thoracic aorta: Secondary | ICD-10-CM

## 2021-05-12 MED ORDER — IOPAMIDOL (ISOVUE-370) INJECTION 76%
75.0000 mL | Freq: Once | INTRAVENOUS | Status: AC | PRN
  Administered 2021-05-12: 75 mL via INTRAVENOUS

## 2021-05-23 ENCOUNTER — Ambulatory Visit (INDEPENDENT_AMBULATORY_CARE_PROVIDER_SITE_OTHER): Payer: Self-pay | Admitting: Thoracic Surgery (Cardiothoracic Vascular Surgery)

## 2021-05-23 ENCOUNTER — Encounter: Payer: Self-pay | Admitting: Thoracic Surgery (Cardiothoracic Vascular Surgery)

## 2021-05-23 ENCOUNTER — Other Ambulatory Visit: Payer: Self-pay

## 2021-05-23 VITALS — BP 154/77 | HR 65 | Resp 20 | Ht 74.0 in | Wt 298.0 lb

## 2021-05-23 DIAGNOSIS — Z9889 Other specified postprocedural states: Secondary | ICD-10-CM

## 2021-05-23 NOTE — Progress Notes (Signed)
[address]       [place] [address]             [phone]                    DUPREE GIVLER [facility] Medical Record #[id number] Date of Birth: 12/27/1985  Referring: Kathleene Hazel* Primary Care: Patient, No Pcp Per (Inactive) Primary Cardiologist: Verne Carrow, MD  Chief Complaint:    Chief Complaint  Patient presents with   Thoracic Aortic Dissection    6 month f/u with CTA Chest 05/12/21    History of Present Illness:    Aaron [name] 35 y.o. male 35 year old male presents in follow-up after undergoing a type a dissection repair with a Dr. Maren Beach in August 2021.  He has no complaints.    Past Medical History:  Diagnosis Date   Tobacco abuse     Past Surgical History:  Procedure Laterality Date   AORTIC ARCH ANGIOGRAPHY N/A 02/08/2020   Procedure: AORTIC ARCH ANGIOGRAPHY;  Surgeon: Kathleene Hazel, MD;  Location: [facility];  Service: Cardiovascular;  Laterality: N/A;  Aortic Root   FRACTURE SURGERY     HAND SURGERY     HERNIA REPAIR     LEFT HEART CATH AND CORONARY ANGIOGRAPHY N/A 02/08/2020   Procedure: LEFT HEART CATH AND CORONARY ANGIOGRAPHY;  Surgeon: Kathleene Hazel, MD;  Location: [facility];  Service: Cardiovascular;  Laterality: N/A;   REPAIR OF ACUTE ASCENDING THORACIC AORTIC DISSECTION N/A 02/08/2020   Procedure: REPAIR OF TYPE A THORACIC AORTIC DISSECTION USING A 32 MM STRAIGHT GRAFT; CIRC ARREST; AND RIGHT AXILLARY ARTERY CANNULATION.;  Surgeon: Kerin Perna, MD;  Location: [facility] OR;  Service: Vascular;  Laterality: N/A;   TEE WITHOUT CARDIOVERSION N/A 02/08/2020   Procedure: TRANSESOPHAGEAL ECHOCARDIOGRAM (TEE);  Surgeon: Donata Clay, Theron Arista, MD;  Location: [facility] OR;  Service: Open Heart Surgery;  Laterality: N/A;    Family History  Problem Relation Age of Onset   Diabetes Mother      Social History   Tobacco Use  Smoking Status Every Day  Smokeless Tobacco Never     Social History   Substance and Sexual Activity  Alcohol Use No     No Known Allergies  Current Outpatient Medications  Medication Sig Dispense Refill   lisinopril (ZESTRIL) 10 MG tablet Take 1 tablet (10 mg total) by mouth daily. 30 tablet 1   metoprolol tartrate (LOPRESSOR) 25 MG tablet Take 1 tablet (25 mg total) by mouth 2 (two) times daily. 60 tablet 6   No current facility-administered medications for this visit.    Review of Systems  All other systems reviewed and are negative.  PHYSICAL EXAMINATION: BP (!) 154/77 (BP Location: Right Arm, Patient Position: Sitting)   Pulse 65   Resp 20   Ht 6\' 2"  (1.88 m)   Wt 298 lb (135.2 kg)   SpO2 98% Comment: RA  BMI 38.26 kg/m   Physical Exam Constitutional:      General: He is not in acute distress.    Appearance: He is normal weight.  Cardiovascular:     Rate and Rhythm: Normal rate.  Pulmonary:     Effort: Pulmonary effort is normal. No respiratory distress.  Musculoskeletal:     Cervical back: Normal range of motion.  Skin:    General: Skin is warm and dry.  Neurological:     Mental Status: He is alert.  Diagnostic Studies & Laboratory data:     Recent Radiology Findings:   CT ANGIO CHEST AORTA W/CM & OR WO/CM  Result Date: 05/12/2021 CLINICAL DATA:  Follow-up type a aortic dissection and ascending graft repair EXAM: CT ANGIOGRAPHY CHEST WITH CONTRAST TECHNIQUE: Multidetector CT imaging of the chest was performed using the standard protocol during bolus administration of intravenous contrast. Multiplanar CT image reconstructions and MIPs were obtained to evaluate the vascular anatomy. CONTRAST:  6mL ISOVUE-370 IOPAMIDOL (ISOVUE-370) INJECTION 76% COMPARISON:  12/29/2020 FINDINGS: Cardiovascular: Preferential opacification of the thoracic aorta. 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 Unchanged enlargement of the aortic root, measuring approximately 4.7 cm in caliber, the dissected descending thoracic aorta measuring up to 3.0 x 3.0 cm. Normal heart size. No pericardial effusion. Mediastinum/Nodes: No enlarged mediastinal, hilar, or axillary lymph nodes. Thyroid gland, trachea, and esophagus demonstrate no significant findings. Lungs/Pleura: Lungs are clear. No pleural effusion or pneumothorax. Upper Abdomen: No acute abnormality.  Hepatic steatosis. Musculoskeletal: No chest wall abnormality. No acute or significant osseous findings. Status post median sternotomy. Review of the MIP images confirms the above findings. IMPRESSION: 1. 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 2. Unchanged enlargement of the aortic root, measuring approximately 4.7 cm in caliber, the dissected proximal descending thoracic aorta measuring up to 3.0 cm. 3. Hepatic steatosis. Electronically Signed   By: Jearld Lesch M.D.   On: 05/12/2021 09:46       I have independently reviewed the above radiology studies  and reviewed the findings with the patient.   Recent Lab Findings: Lab Results  Component Value Date   WBC 4.6 12/29/2020   HGB 14.8 12/29/2020   HCT 44.3 12/29/2020   PLT 168 12/29/2020   GLUCOSE 88 03/05/2021   ALT 43 02/12/2020   AST 22 02/12/2020   NA 140 03/05/2021   K 4.3 03/05/2021   CL 102 03/05/2021   CREATININE 1.38 (H) 03/05/2021   BUN 11 03/05/2021   CO2 24 03/05/2021   INR 0.9 02/09/2020         Assessment / Plan:   35 year old male status post type a dissection repair.  He remains hypertensive.  He never followed up in hypertension clinic but a new referral was given for that.  I  will see him back in 6 months with another CTA.  But I explained to him the importance of tight blood pressure control and also risk factor modification.      Aaron [name] 05/23/2021 5:07 PM

## 2021-05-27 ENCOUNTER — Other Ambulatory Visit: Payer: Self-pay | Admitting: *Deleted

## 2021-05-27 MED ORDER — LISINOPRIL 10 MG PO TABS
10.0000 mg | ORAL_TABLET | Freq: Every day | ORAL | 3 refills | Status: DC
Start: 2021-05-27 — End: 2021-08-13

## 2021-08-09 ENCOUNTER — Other Ambulatory Visit: Payer: Self-pay | Admitting: Cardiothoracic Surgery

## 2021-08-12 ENCOUNTER — Other Ambulatory Visit (HOSPITAL_BASED_OUTPATIENT_CLINIC_OR_DEPARTMENT_OTHER): Payer: Self-pay

## 2021-08-13 ENCOUNTER — Other Ambulatory Visit: Payer: Self-pay | Admitting: *Deleted

## 2021-08-13 ENCOUNTER — Telehealth: Payer: Self-pay | Admitting: Cardiovascular Disease

## 2021-08-13 MED ORDER — LISINOPRIL 10 MG PO TABS: 10.0000 mg | ORAL_TABLET | Freq: Every day | ORAL | 0 refills | Status: DC

## 2021-08-13 MED ORDER — METOPROLOL TARTRATE 25 MG PO TABS: 25.0000 mg | ORAL_TABLET | Freq: Two times a day (BID) | ORAL | 1 refills | Status: DC

## 2021-08-13 NOTE — Telephone Encounter (Signed)
°*  STAT* If patient is at the pharmacy, call can be transferred to refill team.   1. Which medications need to be refilled? (please list name of each medication and dose if known)  lisinopril (ZESTRIL) 10 MG tablet metoprolol tartrate (LOPRESSOR) 25 MG tablet   2. Which pharmacy/location (including street and city if local pharmacy) is medication to be sent to? [facility] - [place] ([place]), [place] - [address]  3. Do they need a 30 day or 90 day supply? 90 with refills

## 2021-09-08 ENCOUNTER — Ambulatory Visit (INDEPENDENT_AMBULATORY_CARE_PROVIDER_SITE_OTHER): Payer: Self-pay | Admitting: Cardiovascular Disease

## 2021-09-08 ENCOUNTER — Other Ambulatory Visit: Payer: Self-pay

## 2021-09-08 ENCOUNTER — Encounter: Payer: Self-pay | Admitting: Cardiovascular Disease

## 2021-09-08 VITALS — BP 138/64 | HR 69 | Ht 74.0 in | Wt 300.8 lb

## 2021-09-08 DIAGNOSIS — Z8679 Personal history of other diseases of the circulatory system: Secondary | ICD-10-CM

## 2021-09-08 DIAGNOSIS — I10 Essential (primary) hypertension: Secondary | ICD-10-CM

## 2021-09-08 DIAGNOSIS — Z9889 Other specified postprocedural states: Secondary | ICD-10-CM

## 2021-09-08 MED ORDER — LISINOPRIL 10 MG PO TABS: 10.0000 mg | ORAL_TABLET | Freq: Every day | ORAL | 3 refills | Status: DC

## 2021-09-08 MED ORDER — METOPROLOL TARTRATE 25 MG PO TABS: 25.0000 mg | ORAL_TABLET | Freq: Two times a day (BID) | ORAL | 3 refills | Status: DC

## 2021-09-08 NOTE — Patient Instructions (Signed)
Medication Instructions:  ?No changes ?*If you need a refill on your cardiac medications before your next appointment, please call your pharmacy* ? ? ?Lab Work: ?none ?If you have labs (blood work) drawn today and your tests are completely normal, you will receive your results only by: ?MyChart Message (if you have MyChart) OR ?A paper copy in the mail ?If you have any lab test that is abnormal or we need to change your treatment, we will call you to review the results. ? ? ?Testing/Procedures: ?none ? ? ?Follow-Up: ?At [facility], you and your health needs are our priority.  As part of our continuing mission to provide you with exceptional heart care, we have created designated Provider Care Teams.  These Care Teams include your primary Cardiologist (physician) and Advanced Practice Providers (APPs -  Physician Assistants and Nurse Practitioners) who all work together to provide you with the care you need, when you need it. ? ?We recommend signing up for the patient portal called "MyChart".  Sign up information is provided on this After Visit Summary.  MyChart is used to connect with patients for Virtual Visits (Telemedicine).  Patients are able to view lab/test results, encounter notes, upcoming appointments, etc.  Non-urgent messages can be sent to your provider as well.   ?To learn more about what you can do with MyChart, go to [email or web address].   ? ?Your next appointment:   ?12 month(s) ? ?The format for your next appointment:   ?In Person ? ?Provider:   ?Christopher McAlhany, MD   ? ? ?Other Instructions ?  ?

## 2021-09-08 NOTE — Progress Notes (Signed)
? ?Chief Complaint  ?Patient presents with  ? Follow-up  ?  Aortic dissection  ? 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 Type A aortic dissection repair in August 2021. He has done well since then.  ? ?He is here today for follow up. The patient denies any chest pain, dyspnea, palpitations, lower extremity edema, orthopnea, PND, dizziness, near syncope or syncope.  ? ?Primary Care Physician: Patient, No Pcp Per (Inactive) ? ?Past Medical History:  ?Diagnosis Date  ? Tobacco abuse   ? ? ?Past Surgical History:  ?Procedure Laterality Date  ? AORTIC ARCH ANGIOGRAPHY N/A 02/08/2020  ? Procedure: AORTIC ARCH ANGIOGRAPHY;  Surgeon: Kathleene Hazel, MD;  [facility]: [facility];  Service: Cardiovascular;  Laterality: N/A;  Aortic Root  ? FRACTURE SURGERY    ? HAND SURGERY    ? HERNIA REPAIR    ? LEFT HEART CATH AND CORONARY ANGIOGRAPHY N/A 02/08/2020  ? Procedure: LEFT HEART CATH AND CORONARY ANGIOGRAPHY;  Surgeon: Kathleene Hazel, MD;  Location: [facility];  Service: Cardiovascular;  Laterality: N/A;  ? REPAIR OF ACUTE ASCENDING THORACIC AORTIC DISSECTION N/A 02/08/2020  ? Procedure: REPAIR OF TYPE A THORACIC AORTIC DISSECTION USING A 32 MM STRAIGHT GRAFT; CIRC ARREST; AND RIGHT AXILLARY ARTERY CANNULATION.;  Surgeon: Kerin Perna, MD;  Location: [facility] OR;  Service: Vascular;  Laterality: N/A;  ? TEE WITHOUT CARDIOVERSION N/A 02/08/2020  ? Procedure: TRANSESOPHAGEAL ECHOCARDIOGRAM (TEE);  Surgeon: Donata Clay, Theron Arista, MD;  Location: [facility] OR;  Service: Open Heart Surgery;  Laterality: N/A;  ? ? ?Current Outpatient Medications  ?Medication Sig Dispense Refill  ? lisinopril (ZESTRIL) 10 MG tablet Take 1 tablet (10 mg total) by mouth daily. 90 tablet 3  ? metoprolol tartrate (LOPRESSOR) 25 MG tablet Take 1 tablet (25 mg total) by mouth 2  (two) times daily. 180 tablet 3  ? ?No current facility-administered medications for this visit.  ? ? ?No Known Allergies ? ?Social History  ? ?Socioeconomic History  ? Marital status: Single  ?  Spouse name: Not on file  ? Number of children: Not on file  ? Years of education: Not on file  ? Highest education level: Not on file  ?Occupational History  ? Not on file  ?Tobacco Use  ? Smoking status: Every Day  ? Smokeless tobacco: Never  ?Vaping Use  ? Vaping Use: Never used  ?Substance and Sexual Activity  ? Alcohol use: No  ? Drug use: Yes  ?  Types: Marijuana  ? Sexual activity: Not on file  ?Other Topics Concern  ? Not on file  ?Social History Narrative  ? Not on file  ? ?Social Determinants of Health  ? ?Financial Resource Strain: Not on file  ?Food Insecurity: Not on file  ?Transportation Needs: Not on file  ?Physical Activity: Not on file  ?Stress: Not on file  ?Social Connections: Not on file  ?Intimate Partner Violence: Not on file  ? ? ?Family History  ?Problem Relation Age of Onset  ? Diabetes Mother   ? ? ?Review of Systems:  As stated in the HPI and otherwise negative.  ? ?BP 138/64   Pulse 69   Ht 6\' 2"  (1.88 m)   Wt (!) 300 lb 12.8 oz (136.4 kg)   SpO2 100%   BMI 38.62 kg/m?  ? ?  Physical Examination: ?General: Well developed, well nourished, NAD  ?HEENT: OP clear, mucus membranes moist  ?SKIN: warm, dry. No rashes. ?Neuro: No focal deficits  ?Musculoskeletal: Muscle strength 5/5 all ext  ?Psychiatric: Mood and affect normal  ?Neck: No JVD, no carotid bruits, no thyromegaly, no lymphadenopathy.  ?Lungs:Clear bilaterally, no wheezes, rhonci, crackles ?Cardiovascular: Regular rate and rhythm. No murmurs, gallops or rubs. ?Abdomen:Soft. Bowel sounds present. Non-tender.  ?Extremities: No lower extremity edema. Pulses are 2 + in the bilateral DP/PT. ? ?EKG:  EKG is not ordered today. ?The ekg ordered today demonstrates  ? ?Recent Labs: ?12/29/2020: Hemoglobin 14.8; Platelets 168 ?03/05/2021: BUN 11;  Creatinine, Ser 1.38; Potassium 4.3; Sodium 140  ? ?Lipid Panel ?No results found for: CHOL, TRIG, HDL, CHOLHDL, VLDL, LDLCALC, LDLDIRECT ?  ?Wt Readings from Last 3 Encounters:  ?09/08/21 (!) 300 lb 12.8 oz (136.4 kg)  ?05/23/21 298 lb (135.2 kg)  ?03/05/21 (!) 302 lb (137 kg)  ?  ? ?Assessment and Plan:  ? ?1. Thoracic aortic dissection: S/p repair in 2021. This is followed in the CT surgery office. He has stopped smoking. No chest pain.  ? ?2. HTN: BP controlled today. Continue Lisinopril and metoprolol. He is asked to buy a BP cuff for home and let us know if his BP runs higher. I would increase his Lisinopril if a change is needed.  ? ?Labs/ tests ordered today include:  ?No orders of the defined types were placed in this encounter. ? ?Disposition:   F/U with me in one year.  ? ? ?Signed, ?Verne Carrow, MD ?09/08/2021 8:59 AM    ?[facility] ?[address] [place], [place], [place]  [address] ?Phone: [phone]; Fax: [phone]  ? ? ?

## 2022-01-20 ENCOUNTER — Telehealth: Payer: Self-pay | Admitting: Cardiovascular Disease

## 2022-01-20 MED ORDER — LISINOPRIL 10 MG PO TABS: 10.0000 mg | ORAL_TABLET | Freq: Every day | ORAL | 2 refills | Status: DC

## 2022-01-20 NOTE — Telephone Encounter (Signed)
*  STAT* If patient is at the pharmacy, call can be transferred to refill team.   1. Which medications need to be refilled? (please list name of each medication and dose if known) lisinopril (ZESTRIL) 10 MG tablet  2. Which pharmacy/location (including street and city if local pharmacy) is medication to be sent to? [facility] - [place] ([place]  - [address]  3. Do they need a 30 day or 90 day supply? 30 day  Patient is completely out of medication

## 2022-01-20 NOTE — Telephone Encounter (Signed)
Pt's medication was sent to pt's pharmacy as requested. Confirmation received.  °

## 2022-04-28 ENCOUNTER — Other Ambulatory Visit: Payer: Self-pay | Admitting: Thoracic Surgery (Cardiothoracic Vascular Surgery)

## 2022-04-28 DIAGNOSIS — I71019 Dissection of thoracic aorta, unspecified: Secondary | ICD-10-CM

## 2022-04-28 DIAGNOSIS — I7100 Dissection of unspecified site of aorta: Secondary | ICD-10-CM

## 2022-06-05 ENCOUNTER — Encounter: Payer: Self-pay | Admitting: Thoracic Surgery (Cardiothoracic Vascular Surgery)

## 2022-06-05 ENCOUNTER — Telehealth: Payer: Self-pay | Admitting: Thoracic Surgery (Cardiothoracic Vascular Surgery)

## 2022-06-05 ENCOUNTER — Ambulatory Visit: Admission: RE | Admit: 2022-06-05 | Payer: Self-pay | Source: Ambulatory Visit

## 2022-06-05 ENCOUNTER — Ambulatory Visit: Payer: Self-pay

## 2022-07-07 ENCOUNTER — Inpatient Hospital Stay: Admission: RE | Admit: 2022-07-07 | Payer: Self-pay | Source: Ambulatory Visit

## 2022-07-07 ENCOUNTER — Other Ambulatory Visit: Payer: Self-pay

## 2022-07-23 ENCOUNTER — Inpatient Hospital Stay: Admission: RE | Admit: 2022-07-23 | Payer: Self-pay | Source: Ambulatory Visit

## 2022-07-23 ENCOUNTER — Other Ambulatory Visit: Payer: Self-pay

## 2023-02-04 ENCOUNTER — Other Ambulatory Visit: Payer: Self-pay | Admitting: Cardiovascular Disease

## 2023-02-09 IMAGING — CT CT ANGIO CHEST
4 of 8 series · 15 of 36 positions shown · IV contrast (iopamidol)
Comparison: 12/29/2020

CLINICAL DATA: Follow-up type a aortic dissection and ascending
graft repair

EXAM:
CT ANGIOGRAPHY CHEST WITH CONTRAST
TECHNIQUE: Multidetector CT imaging of the chest was performed using the
standard protocol during bolus administration of intravenous
contrast. Multiplanar CT image reconstructions and MIPs were
obtained to evaluate the vascular anatomy.
CONTRAST:  75mL [id number] IOPAMIDOL ([id number]) INJECTION 76%

[Series 7: chest w/o 2.00 br40 s3 axial · axial · non-contrast · 0.64mm/px · z∈[+1549,+1797]mm · 5 of 186 slices shown]
[im 31/186  lung]
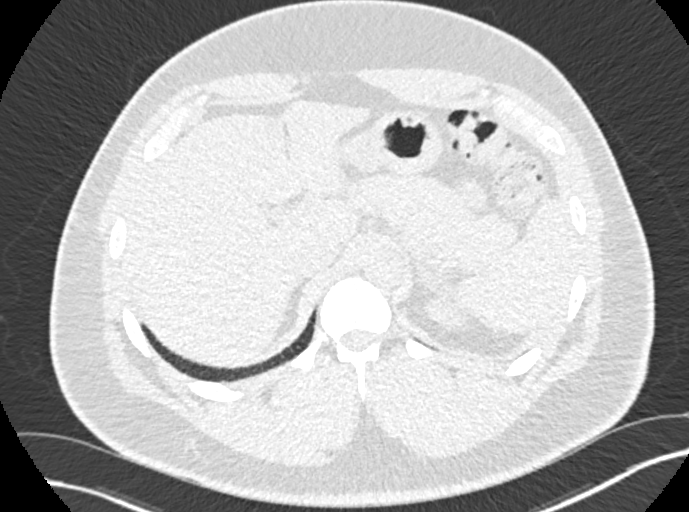
[im 62/186  mediastinal]
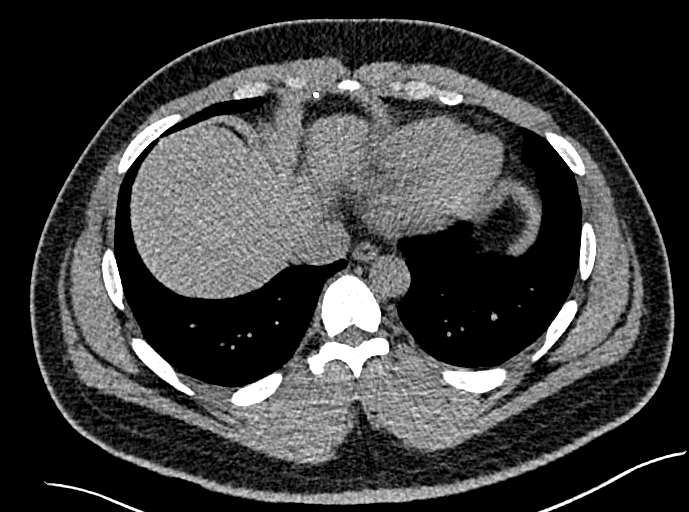
[im 93/186  lung]
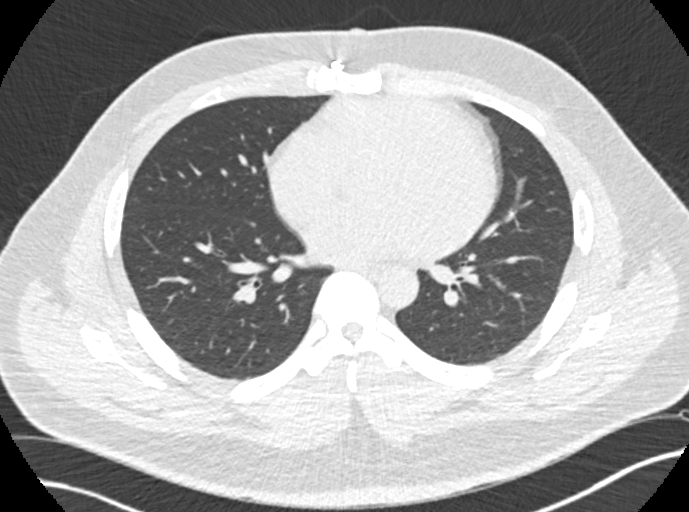
[im 124/186  mediastinal]
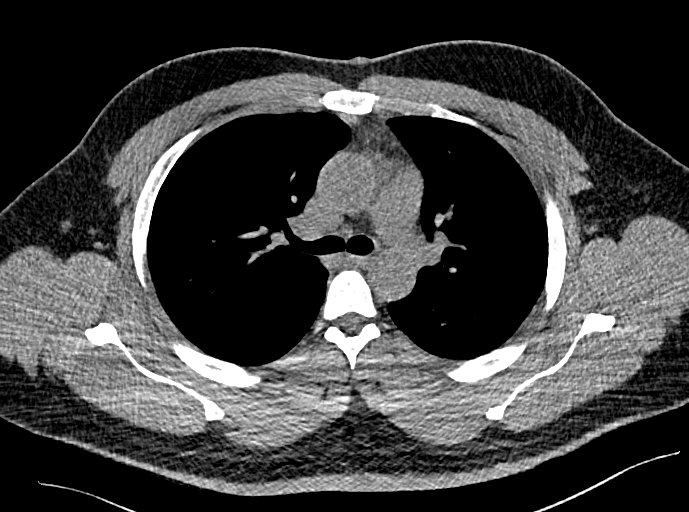
[im 155/186  lung]
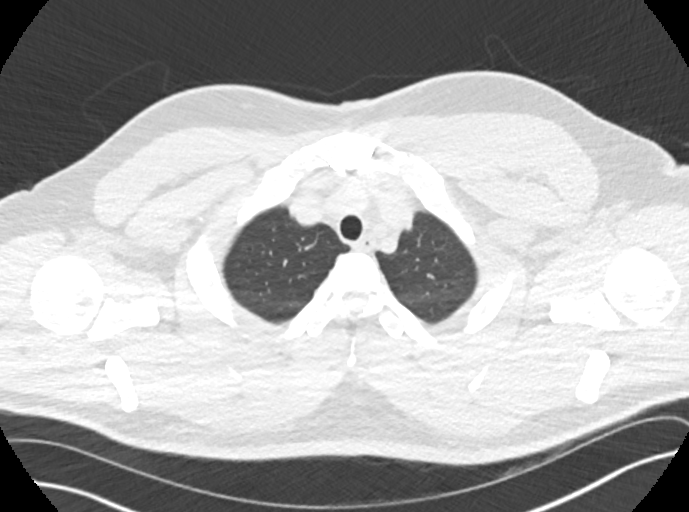

[Series 9: chest w/o 2.00 br60 s3 axial · axial · non-contrast · 0.64mm/px · z∈[+1549,+1797]mm · 5 of 186 slices shown]
[im 31/186  lung]
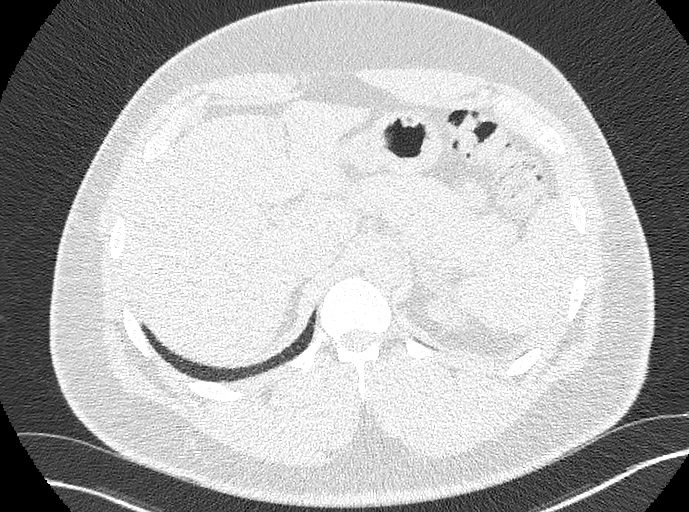
[im 62/186  lung]
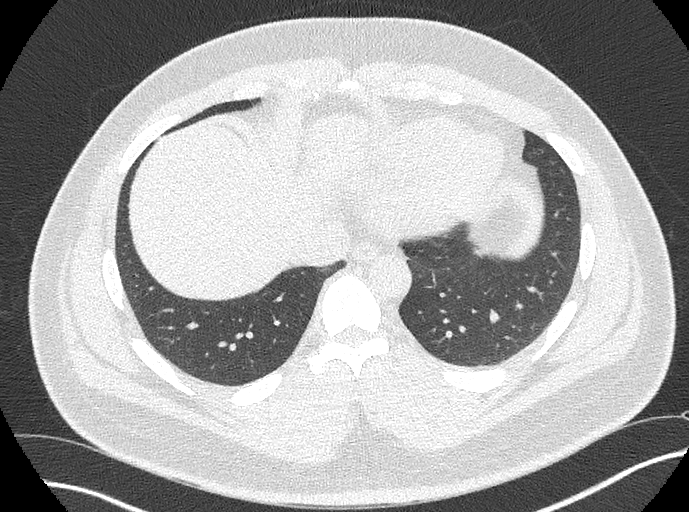
[im 93/186  lung]
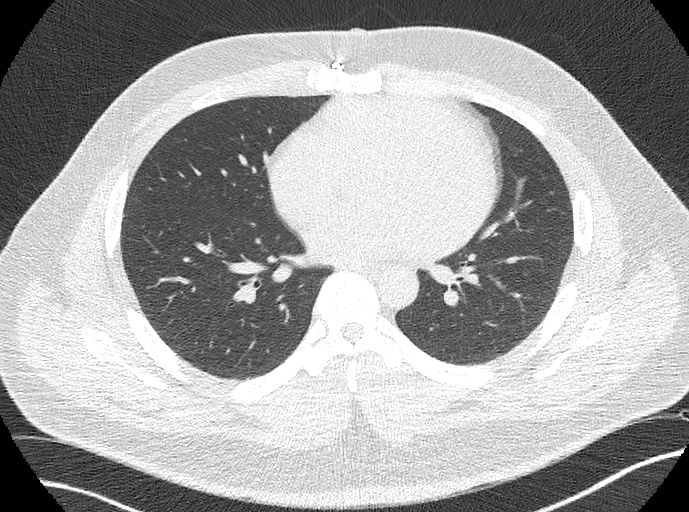
[im 124/186  lung]
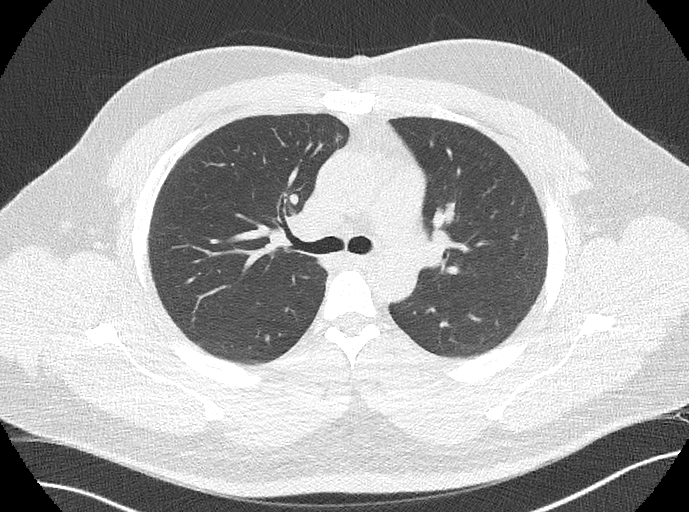
[im 155/186  lung]
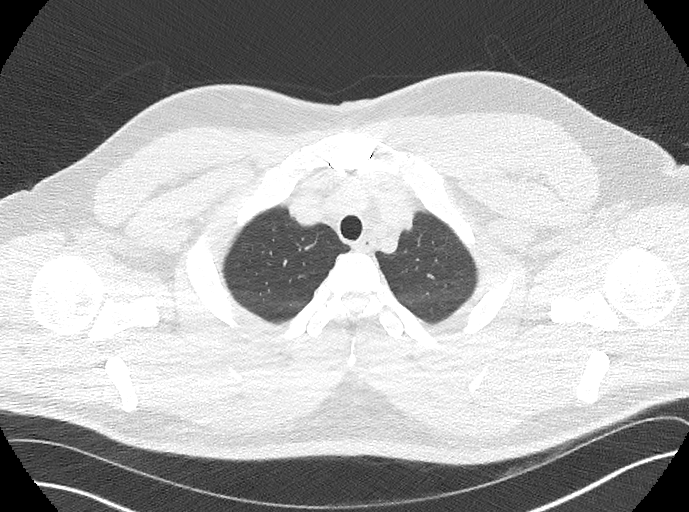

[Series 11: cta thorax 2.00 bv36 s3 axial arterial · axial · arterial · 0.92mm/px · z∈[+1521,+1707]mm · 4 of 188 slices shown]
[im 32/188  lung]
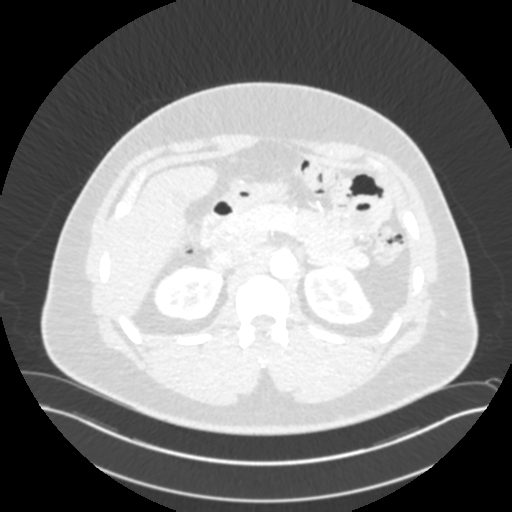
[im 63/188  lung]
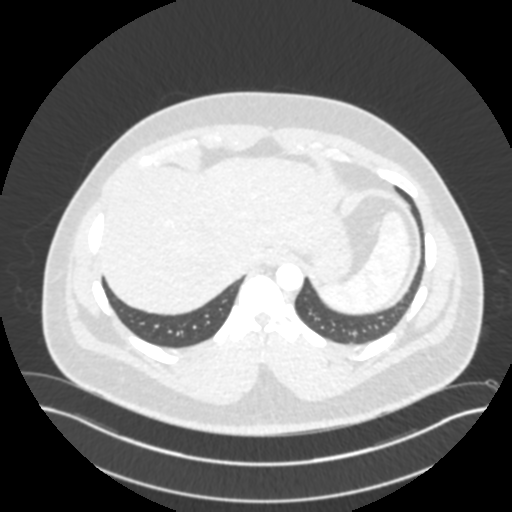
[im 94/188  lung]
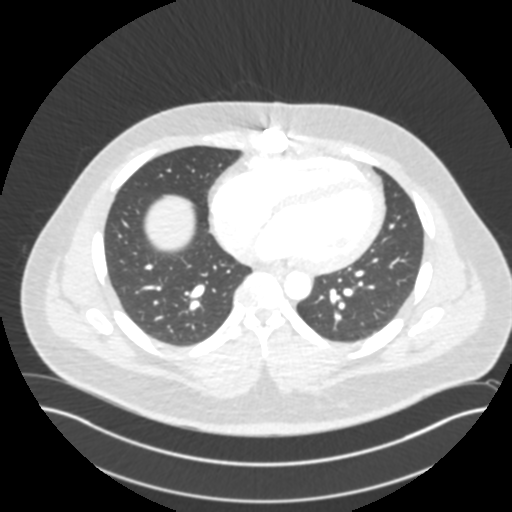
[im 125/188  lung]
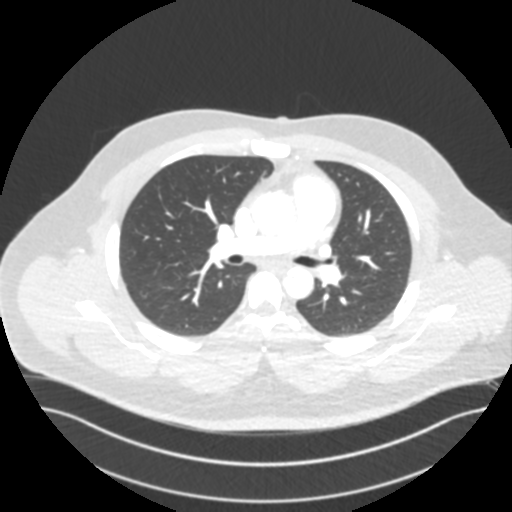

[Series 16: cta thorax 2.00 bv36 s3 cor st · coronal · 0.74mm/px · 1 of 236 slices shown]
[im 118/236  mediastinal]
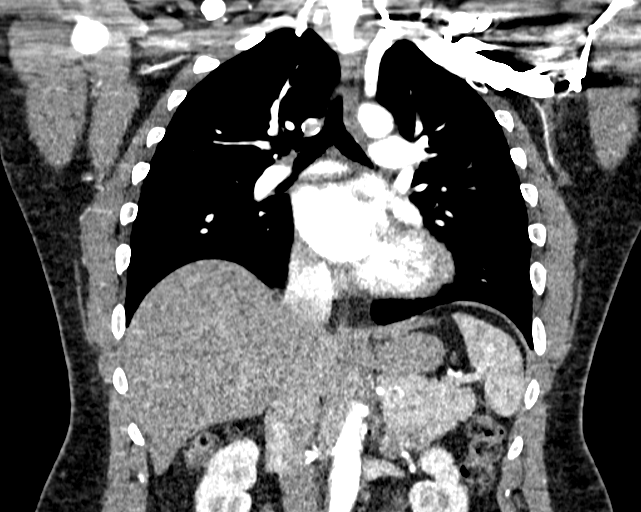

[15 of 36 positions shown; findings below may reference images not displayed]

FINDINGS: Cardiovascular: Preferential opacification of the thoracic aorta.
Unchanged contour and caliber of the thoracic aorta, status post
graft repair of the tubular ascending thoracic aorta, with a
redemonstrated dissection of the descending thoracic aorta extending
into the imaged portion of the upper abdominal aorta. Unchanged
enlargement of the aortic root, measuring approximately 4.7 cm in
caliber, the dissected descending thoracic aorta measuring up to
x 3.0 cm. Normal heart size. No pericardial effusion.

Mediastinum/Nodes: No enlarged mediastinal, hilar, or axillary lymph
nodes. Thyroid gland, trachea, and esophagus demonstrate no
significant findings.

Lungs/Pleura: Lungs are clear. No pleural effusion or pneumothorax.

Upper Abdomen: No acute abnormality.  Hepatic steatosis.

Musculoskeletal: No chest wall abnormality. No acute or significant
osseous findings. Status post median sternotomy.

Review of the MIP images confirms the above findings.
IMPRESSION: 1. Unchanged contour and caliber of the thoracic aorta, status post
graft repair of the tubular ascending thoracic aorta, with a
redemonstrated dissection of the descending thoracic aorta extending
into the imaged portion of the upper abdominal aorta.
2. Unchanged enlargement of the aortic root, measuring approximately
4.7 cm in caliber, the dissected proximal descending thoracic aorta
measuring up to 3.0 cm.
3. Hepatic steatosis.

## 2023-03-01 ENCOUNTER — Other Ambulatory Visit: Payer: Self-pay | Admitting: Cardiovascular Disease

## 2023-03-29 ENCOUNTER — Other Ambulatory Visit: Payer: Self-pay | Admitting: Cardiovascular Disease

## 2023-04-13 ENCOUNTER — Ambulatory Visit: Payer: Self-pay | Attending: Cardiovascular Disease | Admitting: Cardiovascular Disease

## 2023-04-13 ENCOUNTER — Encounter: Payer: Self-pay | Admitting: Cardiovascular Disease

## 2023-04-13 VITALS — BP 128/64 | HR 57 | Ht 74.0 in | Wt 290.0 lb

## 2023-04-13 DIAGNOSIS — Z9889 Other specified postprocedural states: Secondary | ICD-10-CM

## 2023-04-13 DIAGNOSIS — I10 Essential (primary) hypertension: Secondary | ICD-10-CM

## 2023-04-13 DIAGNOSIS — Z8679 Personal history of other diseases of the circulatory system: Secondary | ICD-10-CM

## 2023-04-13 MED ORDER — METOPROLOL TARTRATE 25 MG PO TABS: 12.5000 mg | ORAL_TABLET | Freq: Two times a day (BID) | ORAL | 3 refills | Status: DC

## 2023-04-13 NOTE — Patient Instructions (Addendum)
Medication Instructions:  No changes.  Lopressor refill sent to [facility]  *If you need a refill on your cardiac medications before your next appointment, please call your pharmacy*   Lab Work: none If you have labs (blood work) drawn today and your tests are completely normal, you will receive your results only by: MyChart Message (if you have MyChart) OR A paper copy in the mail If you have any lab test that is abnormal or we need to change your treatment, we will call you to review the results.   Testing/Procedures: none   Follow-Up: At [facility], you and your health needs are our priority.  As part of our continuing mission to provide you with exceptional heart care, we have created designated Provider Care Teams.  These Care Teams include your primary Cardiologist (physician) and Advanced Practice Providers (APPs -  Physician Assistants and Nurse Practitioners) who all work together to provide you with the care you need, when you need it.  We recommend signing up for the patient portal called "MyChart".  Sign up information is provided on this After Visit Summary.  MyChart is used to connect with patients for Virtual Visits (Telemedicine).  Patients are able to view lab/test results, encounter notes, upcoming appointments, etc.  Non-urgent messages can be sent to your provider as well.   To learn more about what you can do with MyChart, go to [email or web address].    Your next appointment:   12 month(s)  Provider:   Verne Carrow, MD     Follow up needed at CT surgery office.  Please call for an appointment if you do not hear from them this week.  [phone]

## 2023-04-13 NOTE — Progress Notes (Signed)
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 Type A aortic dissection repair in August 2021. He has done well since then. He is followed in the CT surgery office by Dr. Cliffton Asters. He was last seen there in 2022 and is overdue for follow up. His blood pressure is followed in our office.   He is here today for follow up. The patient denies any chest pain, palpitations, lower extremity edema, orthopnea, PND, dizziness, near syncope or syncope. Occasional dyspnea with exertion.   Primary Care Physician: Patient, No Pcp Per  Past Medical History:  Diagnosis Date   Aortic dissection (HCC)    Tobacco abuse     Past Surgical History:  Procedure Laterality Date   AORTIC ARCH ANGIOGRAPHY N/A 02/08/2020   Procedure: AORTIC ARCH ANGIOGRAPHY;  Surgeon: Kathleene Hazel, MD;  Location: [facility] INVAS[facility];  Service: Cardiovascular;  Laterality: N/A;  Aortic Root   FRACTURE SURGERY     HAND SURGERY     HERNIA REPAIR     LEFT HEART CATH AND CORONARY ANGIOGRAPHY N/A 02/08/2020   Procedure: LEFT HEART CATH AND CORONARY ANGIOGRAPHY;  Surgeon: Kathleene Hazel, MD;  Location: [facility] INVAS[facility] CV [facility];  Service: Cardiovascular;  Laterality: N/A;   REPAIR OF ACUTE ASCENDING THORACIC AORTIC DISSECTION N/A 02/08/2020   Procedure: REPAIR OF TYPE A THORACIC AORTIC DISSECTION USING A 32 MM STRAIGHT GRAFT; CIRC ARREST; AND RIGHT AXILLARY ARTERY CANNULATION.;  Surgeon: Kerin Perna, MD;  Location: [facility] OR;  Service: Vascular;  Laterality: N/A;   TEE WITHOUT CARDIOVERSION N/A 02/08/2020   Procedure: TRANSESOPHAGEAL ECHOCARDIOGRAM (TEE);  Surgeon: Donata Clay, Theron Arista, MD;  Location: [facility] OR;  Service: Open Heart Surgery;  Laterality: N/A;    Current Outpatient Medications   Medication Sig Dispense Refill   lisinopril (ZESTRIL) 10 MG tablet Take 1 tablet (10 mg total) by mouth daily. 90 tablet 0   No current facility-administered medications for this visit.    No Known Allergies  Social History   Socioeconomic History   Marital status: Single    Spouse name: Not on file   Number of children: Not on file   Years of education: Not on file   Highest education level: Not on file  Occupational History   Not on file  Tobacco Use   Smoking status: Every Day   Smokeless tobacco: Never  Vaping Use   Vaping status: Never Used  Substance and Sexual Activity   Alcohol use: No   Drug use: Yes    Types: Marijuana   Sexual activity: Not on file  Other Topics Concern   Not on file  Social History Narrative   Not on file   Social Determinants of Health   Financial Resource Strain: Not on file  Food Insecurity: Not on file  Transportation Needs: Not on file  Physical Activity: Not on file  Stress: Not on file  Social Connections: Not on file  Intimate Partner Violence: Not on file    Family History  Problem Relation Age of Onset   Diabetes Mother     Review of Systems:  As stated in the HPI and otherwise negative.   BP 128/64   Pulse (!) 57   Ht 6\' 2"  (1.88 m)  Wt 131.5 kg   SpO2 98%   BMI 37.23 kg/m   Physical Examination: General: Well developed, well nourished, NAD  HEENT: OP clear, mucus membranes moist  SKIN: warm, dry. No rashes. Neuro: No focal deficits  Musculoskeletal: Muscle strength 5/5 all ext  Psychiatric: Mood and affect normal  Neck: No JVD, no carotid bruits, no thyromegaly, no lymphadenopathy.  Lungs:Clear bilaterally, no wheezes, rhonci, crackles Cardiovascular: Regular rate and rhythm. No murmurs, gallops or rubs. Abdomen:Soft. Bowel sounds present. Non-tender.  Extremities: No lower extremity edema. Pulses are 2 + in the bilateral DP/PT.  EKG:  EKG is ordered today. The ekg ordered today demonstrates  EKG  Interpretation Date/Time:  Tuesday April 13 2023 11:28:45 EDT Ventricular Rate:  57 PR Interval:  150 QRS Duration:  106 QT Interval:  442 QTC Calculation: 430 R Axis:   58  Text Interpretation: Sinus bradycardia Incomplete right bundle branch block When compared with ECG of 29-Dec-2020 11:58, No significant change was found Confirmed by Verne Carrow [phone]) on 04/13/2023 11:34:30 AM    Recent Labs: No results found for requested labs within last 365 days.   Lipid Panel No results found for: "CHOL", "TRIG", "HDL", "CHOLHDL", "VLDL", "LDLCALC", "LDLDIRECT"   Wt Readings from Last 3 Encounters:  04/13/23 131.5 kg  09/08/21 (!) 136.4 kg  05/23/21 135.2 kg    Assessment and Plan:   1. Thoracic aortic dissection: S/p repair in 2021. He is doing well. This is followed in the CT surgery office by Dr. Cliffton Asters. He is overdue for follow up in their office and for his surveillance. I will contact them today to get him in for testing.   2. HTN: BP is well controlled. Continue Lisinopril and Lopressor.   Labs/ tests ordered today include:   Orders Placed This Encounter  Procedures   EKG 12-Lead   Disposition:   F/U with me in one year.    Signed, Verne Carrow, MD 04/13/2023 11:44 AM    [facility] [address] [place], [place], [place]  [address] Phone: [phone]; Fax: [phone]

## 2023-04-14 ENCOUNTER — Other Ambulatory Visit: Payer: Self-pay | Admitting: Thoracic Surgery (Cardiothoracic Vascular Surgery)

## 2023-04-14 DIAGNOSIS — I7100 Dissection of unspecified site of aorta: Secondary | ICD-10-CM

## 2023-04-26 ENCOUNTER — Other Ambulatory Visit: Payer: Self-pay | Admitting: Thoracic Surgery (Cardiothoracic Vascular Surgery)

## 2023-04-26 DIAGNOSIS — I7100 Dissection of unspecified site of aorta: Secondary | ICD-10-CM

## 2023-05-04 ENCOUNTER — Other Ambulatory Visit: Payer: Self-pay | Admitting: Cardiovascular Disease

## 2023-05-06 ENCOUNTER — Other Ambulatory Visit: Payer: Self-pay | Admitting: Cardiovascular Disease

## 2023-05-07 ENCOUNTER — Telehealth: Payer: Self-pay | Admitting: Cardiovascular Disease

## 2023-05-07 NOTE — Telephone Encounter (Signed)
*  STAT* If patient is at the pharmacy, call can be transferred to refill team.   1. Which medications need to be refilled? (please list name of each medication and dose if known)  metoprolol tartrate (LOPRESSOR) 25 MG tablet  2. Which pharmacy/location (including street and city if local pharmacy) is medication to be sent to? [facility] - [place] ([place]), [place] - [address] Phone: [phone]  Fax: [phone]      3. Do they need a 30 day or 90 day supply? 90  Per  pt the pharmacy is telling him they do not have the refills that were sent in. Please re-send. Thank You

## 2023-05-07 NOTE — Telephone Encounter (Signed)
Refused because the RX was requested too soon

## 2023-07-21 ENCOUNTER — Other Ambulatory Visit: Payer: Self-pay | Admitting: Cardiovascular Disease

## 2023-08-10 ENCOUNTER — Ambulatory Visit: Payer: Self-pay | Admitting: Cardiovascular Disease

## 2023-08-24 ENCOUNTER — Emergency Department (HOSPITAL_BASED_OUTPATIENT_CLINIC_OR_DEPARTMENT_OTHER)
Admission: EM | Admit: 2023-08-24 | Discharge: 2023-08-24 | Discharge status: Against Medical Advice | Payer: Self-pay | Attending: Emergency Medicine | Admitting: Emergency Medicine

## 2023-08-24 ENCOUNTER — Encounter (HOSPITAL_BASED_OUTPATIENT_CLINIC_OR_DEPARTMENT_OTHER): Payer: Self-pay | Admitting: Emergency Medicine

## 2023-08-24 ENCOUNTER — Other Ambulatory Visit: Payer: Self-pay

## 2023-08-24 DIAGNOSIS — R109 Unspecified abdominal pain: Secondary | ICD-10-CM | POA: Insufficient documentation

## 2023-08-24 DIAGNOSIS — Z5321 Procedure and treatment not carried out due to patient leaving prior to being seen by health care provider: Secondary | ICD-10-CM | POA: Insufficient documentation

## 2023-08-24 NOTE — ED Triage Notes (Signed)
 C/o R sided flank pain that rads into groin and lower back x 3 days. Denies urinary symptoms.

## 2023-11-23 ENCOUNTER — Telehealth: Payer: Self-pay | Admitting: Licensed Clinical Social Worker

## 2023-11-23 NOTE — Telephone Encounter (Signed)
 11/18/23 patient left vm expressing interest in therapy.   LCSW attempted to return call and lvm.

## 2023-11-28 ENCOUNTER — Emergency Department
Admission: EM | Admit: 2023-11-28 | Discharge: 2023-11-28 | Disposition: A | Discharge status: Home / Self Care | Payer: Self-pay | Attending: Emergency Medicine | Admitting: Emergency Medicine

## 2023-11-28 ENCOUNTER — Other Ambulatory Visit: Payer: Self-pay

## 2023-11-28 DIAGNOSIS — H00015 Hordeolum externum left lower eyelid: Secondary | ICD-10-CM | POA: Insufficient documentation

## 2023-11-28 DIAGNOSIS — S0502XA Injury of conjunctiva and corneal abrasion without foreign body, left eye, initial encounter: Secondary | ICD-10-CM | POA: Insufficient documentation

## 2023-11-28 DIAGNOSIS — X58XXXA Exposure to other specified factors, initial encounter: Secondary | ICD-10-CM | POA: Insufficient documentation

## 2023-11-28 MED ORDER — FLUORESCEIN SODIUM 1 MG OP STRP
1.0000 | ORAL_STRIP | Freq: Once | OPHTHALMIC | Status: AC
  Administered 2023-11-28: 1 via OPHTHALMIC
  Filled 2023-11-28: qty 1

## 2023-11-28 MED ORDER — TETRACAINE HCL 0.5 % OP SOLN
1.0000 [drp] | Freq: Once | OPHTHALMIC | Status: AC
  Administered 2023-11-28: 1 [drp] via OPHTHALMIC
  Filled 2023-11-28: qty 4

## 2023-11-28 MED ORDER — TOBRAMYCIN 0.3 % OP OINT: TOPICAL_OINTMENT | Freq: Two times a day (BID) | OPHTHALMIC | 0 refills | Status: AC

## 2023-11-28 NOTE — ED Triage Notes (Signed)
 POV with CC of L eye pain with associated light sensitivity that6 started x4 days ago. Pt denies known injury but states does work outside. Sclera is red and eye ap[pears irritated at this time.

## 2023-11-28 NOTE — ED Provider Notes (Signed)
 [facility] Emergency Department Provider Note     Event Date/Time   First MD Initiated Contact with Patient 11/28/23 1919     (approximate)   History   No chief complaint on file.   HPI  Aaron Aaron [name] is Aaron [name] 38 y.o. male presents to the ED for evaluation of light sensitivity in his left eye and pain to his lower left eyelid.  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  Reports since then he has had increased light sensitivity.  He denies visual changes.  He reports Aaron [name] sensation in his eye.  No discharge, fever or sick contacts.        Physical Exam   Triage Vital Signs: ED Triage Vitals  Encounter Vitals Group     BP 11/28/23 1911 (!) 152/64     Systolic BP Percentile --      Diastolic BP Percentile --      Pulse Rate 11/28/23 1911 61     Resp 11/28/23 1908 19     Temp 11/28/23 1911 98.7 F (37.1 C)     Temp Source 11/28/23 1911 Oral     SpO2 11/28/23 1911 99 %     Weight 11/28/23 1910 295 lb (133.8 kg)     Height 11/28/23 1910 6\' 2"  (1.88 m)     Head Circumference --      Peak Flow --      Pain Score 11/28/23 1910 8     Pain Loc --      Pain Education --      Exclude from Growth Chart --     Most recent vital signs: Vitals:   11/28/23 1911 11/28/23 2135  BP: (!) 152/64 120/72  Pulse: 61 63  Resp: 19 18  Temp: 98.7 F (37.1 C) 99.3 F (37.4 C)  SpO2: 99% 97%    General Awake, no distress.  Wearing dark glasses in dark room upon evaluation. HEENT NCAT. PERRL. EOMI without pain.  Left eye reveals excessive tearing.  Sclera is injected.  There is Aaron [name] stye to the lower lid that is mildly tender to palpation.  No discharge, skin changes or protrusion. CV:  Good peripheral perfusion.  RESP:  Normal effort.  ABD:  No distention.  Other:  With fluorescein strip and lamp there is Aaron [name] small corneal abrasion over inferior iris.  No foreign body noted.   ED Results / Procedures  / Treatments   Labs (all labs ordered are listed, but only abnormal results are displayed) Labs Reviewed - No data to display  No results found.  PROCEDURES:  Critical Care performed: No  Procedures   MEDICATIONS ORDERED IN ED: Medications  tetracaine (PONTOCAINE) 0.5 % ophthalmic solution 1-2 drop (1 drop Right Eye Given by Other 11/28/23 2025)  fluorescein ophthalmic strip 1 strip (1 strip Right Eye Given by Other 11/28/23 2025)     IMPRESSION / MDM / ASSESSMENT AND PLAN / ED COURSE  I reviewed the triage vital signs and the nursing notes.                               38 y.o. male presents to the emergency department for evaluation and treatment of left eye pain and light sensitivity. See HPI for further details.   Differential diagnosis includes, but is not limited to corneal abrasion, corneal ulcer,  stye, foreign body, conjunctivitis  Patient's presentation is most consistent with acute complicated illness / injury requiring diagnostic workup.  Patient is alert and oriented.  He is hemodynamic stable.  Physical exam findings are stated above and pertinent for scleral injection of the left eye.  After administration of tetracaine patient reports small improvement of his symptoms.  He was able to open his eye much better.  He denied any vision changes which is reassuring.  Presentation is clinically consistent with Aaron [name] corneal abrasion confirmed by fluorescein stain exam.  Will treat with antibiotic eyedrops.  I advised close follow-up with ophthalmology if symptoms do not improve over Aaron [name] week's time patient verbalized understanding.  Stye was also noted on physical exam in which I encouraged patient to apply warm compresses to treat.  Patient is in stable condition for discharge home.  ED return precautions discussed.  FINAL CLINICAL IMPRESSION(S) / ED DIAGNOSES   Final diagnoses:  Abrasion of left cornea, initial encounter  Hordeolum externum of left lower eyelid     Rx / DC  Orders   ED Discharge Orders          Ordered    tobramycin (TOBREX) 0.3 % ophthalmic ointment  2 times daily        11/28/23 2124           Note:  This document was prepared using Dragon voice recognition software and may include unintentional dictation errors.    Phyllis Breeze, Aaron Winkles A, PA-C 11/28/23 2257    Claria Crofts, MD 11/29/23 [phone]

## 2023-11-28 NOTE — Discharge Instructions (Addendum)
 You were evaluated in the ED for light sensitivity and left eye pain.  Fluorescein exam revealed a corneal abrasion of your left eye.  You have been prescribed antibiotics drops.  Also on physical exam there appeared to be a stye on the lower lid of your left eye please apply warm compresses.  Avoid squeezing or popping.  Gently wash the eyelid with gentle soap and water.  Follow-up with ophthalmology for further evaluation if symptoms persist

## 2024-05-03 ENCOUNTER — Other Ambulatory Visit: Payer: Self-pay | Admitting: Cardiovascular Disease
